# Patient Record
Sex: Male | Born: 1965 | Race: White | Hispanic: No | Marital: Married | Smoking: Never smoker
Health system: Southern US, Community
[De-identification: ages and names within clinical notes are randomized; demographics above are authoritative.]

## PROBLEM LIST (undated history)

## (undated) DIAGNOSIS — E785 Hyperlipidemia, unspecified: Secondary | ICD-10-CM

## (undated) HISTORY — DX: Hyperlipidemia, unspecified: E78.5

---

## 2012-05-21 ENCOUNTER — Other Ambulatory Visit (HOSPITAL_COMMUNITY): Payer: Self-pay | Admitting: Internal Medicine

## 2012-05-22 ENCOUNTER — Other Ambulatory Visit (HOSPITAL_COMMUNITY): Payer: Self-pay | Admitting: Internal Medicine

## 2012-05-22 DIAGNOSIS — Z8249 Family history of ischemic heart disease and other diseases of the circulatory system: Secondary | ICD-10-CM

## 2012-05-24 ENCOUNTER — Encounter (HOSPITAL_COMMUNITY): Payer: BC Managed Care – PPO

## 2012-05-25 ENCOUNTER — Ambulatory Visit (HOSPITAL_COMMUNITY)
Admission: RE | Admit: 2012-05-25 | Discharge: 2012-05-25 | Disposition: A | Discharge status: Home / Self Care | Payer: BC Managed Care – PPO | Source: Ambulatory Visit | Attending: Cardiovascular Disease | Admitting: Cardiovascular Disease

## 2012-05-25 DIAGNOSIS — Z8249 Family history of ischemic heart disease and other diseases of the circulatory system: Secondary | ICD-10-CM

## 2012-10-11 ENCOUNTER — Encounter: Payer: Self-pay | Admitting: Cardiology

## 2014-02-25 ENCOUNTER — Ambulatory Visit: Payer: BC Managed Care – PPO | Admitting: Sports Medicine

## 2014-03-04 ENCOUNTER — Ambulatory Visit
Admission: RE | Admit: 2014-03-04 | Discharge: 2014-03-04 | Disposition: A | Discharge status: Home / Self Care | Payer: 59 | Source: Ambulatory Visit | Attending: Sports Medicine | Admitting: Sports Medicine

## 2014-03-04 ENCOUNTER — Ambulatory Visit (INDEPENDENT_AMBULATORY_CARE_PROVIDER_SITE_OTHER): Payer: 59 | Admitting: Sports Medicine

## 2014-03-04 ENCOUNTER — Encounter: Payer: Self-pay | Admitting: Sports Medicine

## 2014-03-04 VITALS — BP 122/80 | Ht 77.0 in | Wt 250.0 lb

## 2014-03-04 DIAGNOSIS — M25561 Pain in right knee: Secondary | ICD-10-CM

## 2014-03-04 DIAGNOSIS — M25559 Pain in unspecified hip: Secondary | ICD-10-CM

## 2014-03-04 DIAGNOSIS — M25552 Pain in left hip: Principal | ICD-10-CM

## 2014-03-04 DIAGNOSIS — M25572 Pain in left ankle and joints of left foot: Secondary | ICD-10-CM

## 2014-03-04 DIAGNOSIS — M25569 Pain in unspecified knee: Secondary | ICD-10-CM

## 2014-03-04 DIAGNOSIS — M25551 Pain in right hip: Secondary | ICD-10-CM

## 2014-03-04 DIAGNOSIS — M25579 Pain in unspecified ankle and joints of unspecified foot: Secondary | ICD-10-CM

## 2014-03-04 NOTE — Progress Notes (Signed)
Subjective:    Patient ID: Roy Bennett, male    DOB: [birth year], 48 y.o.   MRN: [id number]  HPI Pleasant 48 y.o. male presents to clinic with multiple complaints including 6 months Right knee pain, 2-4 year Left hip pain, and 10+ years of Left ankle pain. He also states that he has had chronic lumbar back pain diagnosed 2 years prior in [place] when he was found on MRI to have multiple disc disease that was managed with physical therapy.  He begins by describing his left ankle pain as being anterolateral, has been present since age 22 since unspecified "ankle injury" with non-traumatic mechanism that was not addressed with any surgical intervention. Since that time he feels his left ankle has always been weaker than the right, no specific exacerbating factors other than weight bearing. No recent overuse or injury endorsed.  In regards to his left hip, this has been present for several years, he points to a location just lateral to his left ASIS as the maximal point of pain. This radiates deep but he denies radiation to groin. This is worsened with hip flexion and weight bearing. He denies pain with sitting. No specific timing of the pain. He has not done anything for this.   Finally, his right knee pain has been going on for approximately 6 months, denies any specific trauma to this site. He describes it as being in the posterior aspect of his knee in a position that is inferolateral. Has been gradual and constant. No specific timing. There is no radiation. He has not felt any clicking or knee-catching. No swelling or bruising at the onset or now.   He has had increase in activity for the past 6-8 months, specifically he started a walking program where he walks 45-60 minutes per day followed by 3 sessions of the "7 minute workout" with various calisthenics such as squats, planks, etc. He endorses and intentional weight loss of 30 pounds over this same time frame.   Review of Systems Per HPI      Objective:   Physical Exam Filed Vitals:   03/04/14 1039  BP: 122/80  Height: 6\' 5"  (1.956 m)  Weight: 250 lb (113.399 kg)  Body mass index is 29.64 kg/(m^2).  General: Well-developed, well-nourished male in NAD, AAOx3  Left Ankle: No visible erythema or swelling. Range of motion is full and painless in all directions and symmetric Strength is 5/5 in all directions. Stable lateral and medial ligaments; squeeze test and kleiger test unremarkable; Talar dome nontender; No pain at base of 5th MT; No tenderness over cuboid; No tenderness on posterior aspects of lateral and medial malleolus No sign of peroneal tendon subluxations Able to walk 4 steps.  Hip: Inspection with no bruising over bony prominences ROM IR: 80 Deg, ER: 80 Deg, Flexion: 120 Deg, Extension: 100 Deg, Abduction: 45 Deg, Adduction: 45 Deg Strength IR: 5/5, ER: 5/5, Flexion: 5/5, Extension: 5/5, marked weakness in abductors, Adduction: 5/5 Pelvic alignment unremarkable to inspection and palpation. Standing hip rotation and gait with borderline / positive bilateral trendelenburg & unsteadiness. Greater trochanter without tenderness to palpation. Tenderness to ASIS No tenderness over piriformis and greater trochanter. Negative piriformis, negative FABER No SI joint tenderness and normal minimal SI movement.  Right Knee: Normal to inspection with no erythema or effusion or obvious bony abnormalities. Palpation with no warmth or joint line tenderness or patellar tenderness or condyle tenderness Palpation does reveal tenderness to lateral origin of gastrocnemius ROM normal in flexion and  mildly decreased extension and normal lower leg rotation. Ligaments with solid consistent endpoints including ACL, PCL, LCL, MCL. Negative Mcmurray's and provocative meniscal tests. Non painful patellar compression. Patellar and quadriceps tendons unremarkable. Hamstring and quadriceps strength is normal.  Feet appear to be in  a neutral to somewhat planus medial arch position. Walking and running evaluation shows markedly pronation with hindfoot valgus. No limp.     Assessment & Plan:  1. Left ankle pain History of old unspecified injury, most likely has caused upward chain stress on knee and ultimately hip. No acute pathology and most likely exacerbated by poor stride and pronation during running.  - Refer to physical therapy to evaluate and treat weakness Roy Bennett) - Give sports inserts with bilateral medial heel wedges  2. Left hip pain Concern for secondary pain from feet / ankles, will need to evaluate for underlying degenerative hip changes with XR; chronic process - Obtain bilateral AP and lateral hip views - Refer to physical therapy to evaluate and treat weakness  3. Right knee pain Tenderness to lateral gastroc insertion site, most likely proximal gastroc strain that has been caused / exacerbated by foot pronation during running - Sport inserts with heel supports as above - Activity as tolerated  Images reviewed:  LEFT HIP - COMPLETE 2+ VIEW  COMPARISON: None.  FINDINGS:  Mild degenerative change of the hip joint. No acute fracture. No  dislocation. No destructive bone lesion.  IMPRESSION:  No acute bony pathology.  RIGHT HIP - COMPLETE 2+ VIEW  COMPARISON: None.  FINDINGS:  No fracture or bone lesion. Hip joint is normally space and aligned  with no significant arthropathic change. Soft tissues are  unremarkable.  IMPRESSION:  Negative.  Followup in 6 weeks. We will need to plan on custom orthotics at that time.   Rosette Reveal, MD Roy Bennett [facility] PGY-3

## 2014-03-05 ENCOUNTER — Telehealth: Payer: Self-pay | Admitting: *Deleted

## 2014-03-05 NOTE — Telephone Encounter (Signed)
Message copied by Laurey Arrow on Wed Mar 05, 2014  9:23 AM ------      Message from: Lilia Argue R      Created: Wed Mar 05, 2014  8:53 AM      Regarding: xray results       Please call this patient and tell him that the x-rays of his hips look good. He has some very mild arthritis in the left hip but not bad. I think he has a followup appointment with me in about 6 weeks.            ----- Message -----         From: Rad Results In Interface         Sent: 03/04/2014   2:21 PM           To: Thurman Coyer, DO                   ------

## 2014-04-14 ENCOUNTER — Ambulatory Visit: Payer: 59 | Admitting: Sports Medicine

## 2014-04-15 ENCOUNTER — Encounter: Payer: Self-pay | Admitting: Sports Medicine

## 2014-04-15 ENCOUNTER — Ambulatory Visit (INDEPENDENT_AMBULATORY_CARE_PROVIDER_SITE_OTHER): Payer: 59 | Admitting: Sports Medicine

## 2014-04-15 VITALS — BP 121/77 | Ht 77.0 in | Wt 245.0 lb

## 2014-04-15 DIAGNOSIS — M2141 Flat foot [pes planus] (acquired), right foot: Secondary | ICD-10-CM

## 2014-04-15 DIAGNOSIS — M2142 Flat foot [pes planus] (acquired), left foot: Secondary | ICD-10-CM

## 2014-04-15 NOTE — Assessment & Plan Note (Addendum)
Pt fitted for custom orthotics today.  He has done well with the Hapad insoles with support.  Does have some straining of the posterior tibialis on the L foot with correction of his gait from previous visit.  Continue to monitor and consider scan if no improvement at next visit.    Patient was fitted for a standard, cushioned, semi-rigid orthotic. The orthotic was heated and afterward the patient stood on the orthotic blank positioned on the orthotic stand. The patient was positioned in subtalar neutral position and 10 degrees of ankle dorsiflexion in a weight bearing stance. After completion of molding, a stable base was applied to the orthotic blank. The blank was ground to a stable position for weight bearing. Size: 14 Base: Blue EVA Posting: None  Additional orthotic padding: None   F/U in 6 wks to see how doing.  If at that point he is interested in custom orthotics for his work shoes, we can make him a pair of semi-rigid work orthotics.

## 2014-04-15 NOTE — Progress Notes (Signed)
Roy Bennett is a 48 y.o. male who presents today for evaluation for orthotics.   Pt was seen about 6 weeks ago for multiple chronic issues including low back, L hip, R knee, and L ankle pain.   He does endorse a previous injury, unknown severity, to his L ankle some years ago.  At last visit, he was fitted with green inserts with medial scaphoid pads for arch support and sent to PT with Dr. Belia Heman for PT of the hip and knee.  Since that point, he has done well, pain in his hip and R knee have improved.  At this point, his lateral L ankle pain has improved now but starting to have more medial pain around the medial malleolus of the L ankle.  He does endorse walking around 2-3 miles per day, sharp pain in the medial malleoli, denies injury, is R foot dominant, and states pain will go away after walking.  He has not tried any ice or OTC medications in this area.    PMHx, PSHx, FHx, social Hx, allergies reviewed and updated.   ROS: Per HPI.  All other systems reviewed and are negative.   Physical Exam Filed Vitals:   04/15/14 0903  BP: 121/77    Physical Examination: General appearance - alert, well appearing, and in no distress Foot Exam:  B/L Pes planus w/ calcaneal valgus.  Collapse of the transverse arch with splaying of toes 2-5 L > R.  No TTP at the MT heads, no Morton's Callous B/L.  ROM full in all ankle planes in both active/passive ROM.  MS 5/5, Neurovascularly intact B/L Foot. Slight calcaneal valgus that worsens with intoeing/plantar flexion for posterior tibialis examination   >50% of the 25 minute visit was spent counseling and discussing management options with the patient.

## 2014-05-26 ENCOUNTER — Ambulatory Visit: Payer: 59 | Admitting: Sports Medicine

## 2014-11-24 ENCOUNTER — Other Ambulatory Visit (HOSPITAL_COMMUNITY): Payer: Self-pay | Admitting: Internal Medicine

## 2014-11-25 ENCOUNTER — Other Ambulatory Visit: Payer: Self-pay | Admitting: Internal Medicine

## 2014-11-25 DIAGNOSIS — Z139 Encounter for screening, unspecified: Secondary | ICD-10-CM

## 2014-11-28 ENCOUNTER — Ambulatory Visit
Admission: RE | Admit: 2014-11-28 | Discharge: 2014-11-28 | Disposition: A | Discharge status: Home / Self Care | Payer: No Typology Code available for payment source | Source: Ambulatory Visit | Attending: Internal Medicine | Admitting: Internal Medicine

## 2014-11-28 DIAGNOSIS — Z139 Encounter for screening, unspecified: Secondary | ICD-10-CM

## 2016-07-01 DIAGNOSIS — Z125 Encounter for screening for malignant neoplasm of prostate: Secondary | ICD-10-CM | POA: Diagnosis not present

## 2016-07-01 DIAGNOSIS — Z13220 Encounter for screening for lipoid disorders: Secondary | ICD-10-CM | POA: Diagnosis not present

## 2016-07-01 DIAGNOSIS — Z0000 Encounter for general adult medical examination without abnormal findings: Secondary | ICD-10-CM | POA: Diagnosis not present

## 2016-07-12 DIAGNOSIS — Z1212 Encounter for screening for malignant neoplasm of rectum: Secondary | ICD-10-CM | POA: Diagnosis not present

## 2016-07-12 DIAGNOSIS — E789 Disorder of lipoprotein metabolism, unspecified: Secondary | ICD-10-CM | POA: Diagnosis not present

## 2016-07-12 DIAGNOSIS — Z0001 Encounter for general adult medical examination with abnormal findings: Secondary | ICD-10-CM | POA: Diagnosis not present

## 2017-12-08 ENCOUNTER — Encounter: Payer: Self-pay | Admitting: Internal Medicine

## 2018-03-22 DIAGNOSIS — L719 Rosacea, unspecified: Secondary | ICD-10-CM | POA: Diagnosis not present

## 2018-03-22 DIAGNOSIS — Z125 Encounter for screening for malignant neoplasm of prostate: Secondary | ICD-10-CM | POA: Diagnosis not present

## 2018-03-22 DIAGNOSIS — J309 Allergic rhinitis, unspecified: Secondary | ICD-10-CM | POA: Diagnosis not present

## 2018-03-22 DIAGNOSIS — Z0000 Encounter for general adult medical examination without abnormal findings: Secondary | ICD-10-CM | POA: Diagnosis not present

## 2018-03-22 DIAGNOSIS — T781XXA Other adverse food reactions, not elsewhere classified, initial encounter: Secondary | ICD-10-CM | POA: Diagnosis not present

## 2018-03-22 DIAGNOSIS — J3089 Other allergic rhinitis: Secondary | ICD-10-CM | POA: Diagnosis not present

## 2018-06-11 DIAGNOSIS — M9905 Segmental and somatic dysfunction of pelvic region: Secondary | ICD-10-CM | POA: Diagnosis not present

## 2018-06-11 DIAGNOSIS — M9903 Segmental and somatic dysfunction of lumbar region: Secondary | ICD-10-CM | POA: Diagnosis not present

## 2018-06-11 DIAGNOSIS — M62830 Muscle spasm of back: Secondary | ICD-10-CM | POA: Diagnosis not present

## 2018-06-11 DIAGNOSIS — M9902 Segmental and somatic dysfunction of thoracic region: Secondary | ICD-10-CM | POA: Diagnosis not present

## 2018-06-21 DIAGNOSIS — M9905 Segmental and somatic dysfunction of pelvic region: Secondary | ICD-10-CM | POA: Diagnosis not present

## 2018-06-21 DIAGNOSIS — M62830 Muscle spasm of back: Secondary | ICD-10-CM | POA: Diagnosis not present

## 2018-06-21 DIAGNOSIS — M9903 Segmental and somatic dysfunction of lumbar region: Secondary | ICD-10-CM | POA: Diagnosis not present

## 2018-06-21 DIAGNOSIS — M9902 Segmental and somatic dysfunction of thoracic region: Secondary | ICD-10-CM | POA: Diagnosis not present

## 2018-06-25 DIAGNOSIS — M62830 Muscle spasm of back: Secondary | ICD-10-CM | POA: Diagnosis not present

## 2018-06-25 DIAGNOSIS — M9902 Segmental and somatic dysfunction of thoracic region: Secondary | ICD-10-CM | POA: Diagnosis not present

## 2018-06-25 DIAGNOSIS — M9905 Segmental and somatic dysfunction of pelvic region: Secondary | ICD-10-CM | POA: Diagnosis not present

## 2018-06-25 DIAGNOSIS — M9903 Segmental and somatic dysfunction of lumbar region: Secondary | ICD-10-CM | POA: Diagnosis not present

## 2018-06-29 DIAGNOSIS — M9905 Segmental and somatic dysfunction of pelvic region: Secondary | ICD-10-CM | POA: Diagnosis not present

## 2018-06-29 DIAGNOSIS — M9903 Segmental and somatic dysfunction of lumbar region: Secondary | ICD-10-CM | POA: Diagnosis not present

## 2018-06-29 DIAGNOSIS — M9902 Segmental and somatic dysfunction of thoracic region: Secondary | ICD-10-CM | POA: Diagnosis not present

## 2018-06-29 DIAGNOSIS — M62830 Muscle spasm of back: Secondary | ICD-10-CM | POA: Diagnosis not present

## 2018-07-11 DIAGNOSIS — Z1211 Encounter for screening for malignant neoplasm of colon: Secondary | ICD-10-CM | POA: Diagnosis not present

## 2018-07-11 DIAGNOSIS — Z1212 Encounter for screening for malignant neoplasm of rectum: Secondary | ICD-10-CM | POA: Diagnosis not present

## 2018-07-19 ENCOUNTER — Encounter: Payer: Self-pay | Admitting: Gastroenterology

## 2018-08-02 DIAGNOSIS — K5730 Diverticulosis of large intestine without perforation or abscess without bleeding: Secondary | ICD-10-CM | POA: Diagnosis not present

## 2018-08-02 DIAGNOSIS — R195 Other fecal abnormalities: Secondary | ICD-10-CM | POA: Diagnosis not present

## 2018-08-02 DIAGNOSIS — R8589 Other abnormal findings in specimens from digestive organs and abdominal cavity: Secondary | ICD-10-CM | POA: Diagnosis not present

## 2018-08-02 DIAGNOSIS — K648 Other hemorrhoids: Secondary | ICD-10-CM | POA: Diagnosis not present

## 2018-08-02 DIAGNOSIS — Z1211 Encounter for screening for malignant neoplasm of colon: Secondary | ICD-10-CM | POA: Diagnosis not present

## 2018-08-02 DIAGNOSIS — K635 Polyp of colon: Secondary | ICD-10-CM | POA: Diagnosis not present

## 2018-08-02 DIAGNOSIS — D122 Benign neoplasm of ascending colon: Secondary | ICD-10-CM | POA: Diagnosis not present

## 2018-08-03 DIAGNOSIS — M9905 Segmental and somatic dysfunction of pelvic region: Secondary | ICD-10-CM | POA: Diagnosis not present

## 2018-08-03 DIAGNOSIS — M9903 Segmental and somatic dysfunction of lumbar region: Secondary | ICD-10-CM | POA: Diagnosis not present

## 2018-08-03 DIAGNOSIS — M62830 Muscle spasm of back: Secondary | ICD-10-CM | POA: Diagnosis not present

## 2018-08-03 DIAGNOSIS — M9902 Segmental and somatic dysfunction of thoracic region: Secondary | ICD-10-CM | POA: Diagnosis not present

## 2018-08-09 ENCOUNTER — Encounter: Payer: No Typology Code available for payment source | Admitting: Gastroenterology

## 2018-08-24 DIAGNOSIS — M9902 Segmental and somatic dysfunction of thoracic region: Secondary | ICD-10-CM | POA: Diagnosis not present

## 2018-08-24 DIAGNOSIS — M62830 Muscle spasm of back: Secondary | ICD-10-CM | POA: Diagnosis not present

## 2018-08-24 DIAGNOSIS — M9903 Segmental and somatic dysfunction of lumbar region: Secondary | ICD-10-CM | POA: Diagnosis not present

## 2018-08-24 DIAGNOSIS — M9905 Segmental and somatic dysfunction of pelvic region: Secondary | ICD-10-CM | POA: Diagnosis not present

## 2018-10-17 DIAGNOSIS — Z8619 Personal history of other infectious and parasitic diseases: Secondary | ICD-10-CM | POA: Diagnosis not present

## 2018-10-23 DIAGNOSIS — E782 Mixed hyperlipidemia: Secondary | ICD-10-CM | POA: Diagnosis not present

## 2018-10-23 DIAGNOSIS — Z0000 Encounter for general adult medical examination without abnormal findings: Secondary | ICD-10-CM | POA: Diagnosis not present

## 2018-10-23 DIAGNOSIS — Z8619 Personal history of other infectious and parasitic diseases: Secondary | ICD-10-CM | POA: Diagnosis not present

## 2019-04-08 ENCOUNTER — Other Ambulatory Visit: Payer: Self-pay | Admitting: Internal Medicine

## 2019-04-08 DIAGNOSIS — R221 Localized swelling, mass and lump, neck: Secondary | ICD-10-CM

## 2019-04-16 ENCOUNTER — Ambulatory Visit
Admission: RE | Admit: 2019-04-16 | Discharge: 2019-04-16 | Disposition: A | Discharge status: Home / Self Care | Payer: 59 | Source: Ambulatory Visit | Attending: Internal Medicine | Admitting: Internal Medicine

## 2019-04-16 DIAGNOSIS — R221 Localized swelling, mass and lump, neck: Secondary | ICD-10-CM

## 2019-04-24 ENCOUNTER — Other Ambulatory Visit: Payer: Self-pay | Admitting: Internal Medicine

## 2019-04-25 ENCOUNTER — Other Ambulatory Visit: Payer: Self-pay | Admitting: Internal Medicine

## 2019-04-25 DIAGNOSIS — R221 Localized swelling, mass and lump, neck: Secondary | ICD-10-CM

## 2019-04-25 DIAGNOSIS — E782 Mixed hyperlipidemia: Secondary | ICD-10-CM

## 2019-04-30 ENCOUNTER — Other Ambulatory Visit: Payer: Self-pay | Admitting: Internal Medicine

## 2019-05-01 ENCOUNTER — Ambulatory Visit
Admission: RE | Admit: 2019-05-01 | Discharge: 2019-05-01 | Disposition: A | Discharge status: Home / Self Care | Payer: 59 | Source: Ambulatory Visit | Attending: Internal Medicine | Admitting: Internal Medicine

## 2019-05-01 ENCOUNTER — Other Ambulatory Visit (HOSPITAL_COMMUNITY)
Admission: RE | Admit: 2019-05-01 | Discharge: 2019-05-01 | Disposition: A | Discharge status: Home / Self Care | Payer: 59 | Source: Ambulatory Visit | Attending: Internal Medicine | Admitting: Internal Medicine

## 2019-05-01 DIAGNOSIS — R221 Localized swelling, mass and lump, neck: Secondary | ICD-10-CM | POA: Diagnosis present

## 2019-05-01 DIAGNOSIS — E782 Mixed hyperlipidemia: Secondary | ICD-10-CM | POA: Insufficient documentation

## 2019-05-01 DIAGNOSIS — D34 Benign neoplasm of thyroid gland: Secondary | ICD-10-CM | POA: Insufficient documentation

## 2019-05-01 NOTE — Procedures (Signed)
PROCEDURE SUMMARY:  Using direct ultrasound guidance, 5 passes were made using 25 g needles into the nodule within the right lobe of the thyroid.   Ultrasound was used to confirm needle placements on all occasions.   EBL = trace  Specimens were sent to Pathology for analysis.  See procedure note under Imaging tab in Epic for full procedure details.  WENDY S BLAIR PA-C 05/01/2019 2:53 PM

## 2019-05-02 LAB — CYTOLOGY - NON PAP

## 2019-05-21 ENCOUNTER — Other Ambulatory Visit: Payer: 59

## 2019-11-22 ENCOUNTER — Ambulatory Visit (INDEPENDENT_AMBULATORY_CARE_PROVIDER_SITE_OTHER): Payer: 59 | Admitting: Family Medicine

## 2019-11-22 DIAGNOSIS — Z5329 Procedure and treatment not carried out because of patient's decision for other reasons: Secondary | ICD-10-CM

## 2019-11-22 NOTE — Progress Notes (Signed)
No show

## 2019-11-25 ENCOUNTER — Ambulatory Visit (INDEPENDENT_AMBULATORY_CARE_PROVIDER_SITE_OTHER): Payer: 59 | Admitting: Family Medicine

## 2019-11-25 ENCOUNTER — Other Ambulatory Visit: Payer: Self-pay

## 2019-11-25 ENCOUNTER — Ambulatory Visit (INDEPENDENT_AMBULATORY_CARE_PROVIDER_SITE_OTHER): Payer: 59

## 2019-11-25 ENCOUNTER — Encounter: Payer: Self-pay | Admitting: Family Medicine

## 2019-11-25 ENCOUNTER — Ambulatory Visit: Payer: Self-pay

## 2019-11-25 VITALS — BP 110/82 | HR 75 | Ht 77.0 in | Wt 263.4 lb

## 2019-11-25 DIAGNOSIS — M25552 Pain in left hip: Secondary | ICD-10-CM

## 2019-11-25 DIAGNOSIS — G8929 Other chronic pain: Secondary | ICD-10-CM | POA: Diagnosis not present

## 2019-11-25 NOTE — Patient Instructions (Signed)
Thank you for coming in today. Plan for xray today.  If it shows arthritis or AVN we will proceed to hip replacement.  If it looks normal we will do MRI arthrogram.  I will contact you soon about Xray results.  Keep me updated.

## 2019-11-25 NOTE — Progress Notes (Signed)
Subjective:    I'm seeing this patient as a consultation for:  Dr. Shelia Media. Note will be routed back to referring provider/PCP.  CC: L hip pain  I, Molly Weber, LAT, ATC, am serving as scribe for Dr. Lynne Leader.  HPI: Pt is a 54 y/o male presenting w/ c/o chronic L hip pain that recently flared up a little over a week ago.  He locates his pain to his L ant hip.  He rate his pain as moderate. 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  He was previously seen at [facility] and has had 2 prior hip injections and a trial of physical therapy.  His last x-ray was several years ago.  He thinks the hip injection did not help.  Radiating pain: yes into L thigh Low back pain: yes LE numbness/tingling: yes Aggravating factors: walking; prolonged sitting; Treatments tried: 2 hip injections; PT;   Past medical history, Surgical history, Family history, Social history, Allergies, and medications have been entered into the medical record, reviewed.   Review of Systems: No new headache, visual changes, nausea, vomiting, diarrhea, constipation, dizziness, abdominal pain, skin rash, fevers, chills, night sweats, weight loss, swollen lymph nodes, body aches, joint swelling, muscle aches, chest pain, shortness of breath, mood changes, visual or auditory hallucinations.   Objective:    Vitals:   11/25/19 0830  BP: 110/82  Pulse: 75  SpO2: 96%   General: Well Developed, well nourished, and in no acute distress.  Neuro/Psych: Alert and oriented x3, extra-ocular muscles intact, able to move all 4 extremities, sensation grossly intact. Skin: Warm and dry, no rashes noted.  Respiratory: Not using accessory muscles, speaking in full sentences, trachea midline.  Cardiovascular: Pulses palpable, no extremity edema. Abdomen: Does not appear distended. MSK: Left hip normal-appearing Range of motion significantly limited internal and moderate limited external rotation.  Limited flexion with pain. Passive hip motion and  flexion reproduces pain. Positive FADIR test. Strength intact to flexion.  Diminished 4/5 abduction and adduction  Contralateral right hip normal-appearing Range of motion also slightly limited internal rotation. Strength intact. No pain with motion.  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 Await formal radiology review  Impression and Recommendations:    Assessment and Plan: 54 y.o. 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  Await formal radiology over read however am concerned based on physical exam and x-ray appearance per my interpretation.  At this point he is already had good trial of conservative management including injections and physical therapy.  He has significant pain affecting his quality of life.  Discussed with the patient plan to proceed with referral to orthopedic surgery for surgical intervention.  Expect benefit from total hip replacement.  Doubtful that hip scope would be very helpful in this context..   Orders Placed This Encounter  Procedures  . [place] LIMITED JOINT SPACE STRUCTURES LOW LEFT(NO LINKED CHARGES)    Order Specific Question:   Reason for Exam (SYMPTOM  OR DIAGNOSIS REQUIRED)    Answer:   L hip pain    Order Specific Question:   Preferred imaging location?    Answer:   [facility]  . DG Hip Unilat W OR W/O Pelvis 2-3 Views Left    Standing Status:   Future    Number of Occurrences:   1    Standing Expiration  Date:   11/24/2020    Order Specific Question:   Reason for Exam (SYMPTOM  OR DIAGNOSIS REQUIRED)    Answer:   eval hip DJD?    Order Specific Question:   Preferred imaging location?    Answer:   [facility]    Order Specific Question:   Radiology Contrast Protocol - do NOT remove file path    Answer:    \\[email or web address]  . Ambulatory referral to Orthopedic Surgery    Referral Priority:   Routine    Referral Type:   Surgical    Referral Reason:   Specialty Services Required    Requested Specialty:   Orthopedic Surgery    Number of Visits Requested:   1   No orders of the defined types were placed in this encounter.   Discussed warning signs or symptoms. Please see discharge instructions. Patient expresses understanding.   The above documentation has been reviewed and is accurate and complete Lynne Leader, M.D.

## 2019-11-26 ENCOUNTER — Telehealth: Payer: Self-pay | Admitting: Family Medicine

## 2019-11-26 DIAGNOSIS — G8929 Other chronic pain: Secondary | ICD-10-CM

## 2019-11-26 DIAGNOSIS — M25552 Pain in left hip: Secondary | ICD-10-CM

## 2019-11-26 NOTE — Telephone Encounter (Signed)
Plan for hip MRI arthrogram at [facility].  Will schedule with Dr. Dianah Field for the hip injection 1 hour prior to the MRI.

## 2019-11-26 NOTE — Progress Notes (Signed)
Left hip shows mild arthritis. I am concerned for hip impingement. I recommend that we proceed to MRI arthrogram.

## 2019-12-02 ENCOUNTER — Ambulatory Visit: Payer: 59 | Admitting: Sports Medicine

## 2019-12-09 ENCOUNTER — Ambulatory Visit (INDEPENDENT_AMBULATORY_CARE_PROVIDER_SITE_OTHER): Payer: 59 | Admitting: Sports Medicine

## 2019-12-09 ENCOUNTER — Other Ambulatory Visit: Payer: Self-pay

## 2019-12-09 ENCOUNTER — Ambulatory Visit (INDEPENDENT_AMBULATORY_CARE_PROVIDER_SITE_OTHER): Payer: 59

## 2019-12-09 DIAGNOSIS — M25552 Pain in left hip: Secondary | ICD-10-CM

## 2019-12-09 DIAGNOSIS — G8929 Other chronic pain: Secondary | ICD-10-CM

## 2019-12-09 MED ORDER — GADOBUTROL 1 MMOL/ML IV SOLN
1.0000 mL | Freq: Once | INTRAVENOUS | Status: AC | PRN
  Administered 2019-12-09: 1 mL via INTRAVENOUS

## 2019-12-09 NOTE — Progress Notes (Signed)
    Procedures performed today:    Procedure: Real-time Ultrasound Guided gadolinium contrast injection of left hip Device: Samsung HS60  Verbal informed consent obtained.  Time-out conducted.  Noted no overlying erythema, induration, or other signs of local infection.  Skin prepped in a sterile fashion.  Local anesthesia: Topical Ethyl chloride.  With sterile technique and under real time ultrasound guidance: Using a 22 spinal needle advanced to the femoral head/neck junction, contacted bone and then injected 1 cc Kenalog 40, 2 cc lidocaine, 2 cc bupivacaine, syringe switched and 0.1 cc gadolinium injected, syringe again switched and 10 cc saline used to distend the joint. Joint visualized and capsule seen distending confirming intra-articular placement of contrast material and medication. Completed without difficulty  Advised to call if fevers/chills, erythema, induration, drainage, or persistent bleeding.  Images permanently stored and available for review in the ultrasound unit.  Impression: Technically successful ultrasound guided gadolinium contrast injection for MR arthrography.  Please see separate MR arthrogram report.   Independent interpretation of notes and tests performed by another provider:   None.  Brief History, Exam, Impression, and Recommendations:    Chronic left hip pain Injection for hip MR arthrogram performed today. Further follow-up with primary treating provider.    ___________________________________________ Gwen Her. Dianah Field, M.D., ABFM., CAQSM. Primary Care and [place] Instructor of [place] [facility]

## 2019-12-09 NOTE — Assessment & Plan Note (Signed)
Injection for hip MR arthrogram performed today. Further follow-up with primary treating provider.

## 2019-12-10 NOTE — Progress Notes (Signed)
Roy Bennett hip shows bilateral arthritis medium and bilateral labral tear.  Please schedule follow-up with me in the near future to review the results further and discuss next steps.

## 2019-12-13 ENCOUNTER — Ambulatory Visit: Payer: 59 | Admitting: Orthopaedic Surgery

## 2020-01-03 ENCOUNTER — Ambulatory Visit: Payer: 59 | Admitting: Family Medicine

## 2020-01-03 ENCOUNTER — Encounter: Payer: Self-pay | Admitting: Orthopaedic Surgery

## 2020-01-03 ENCOUNTER — Ambulatory Visit (INDEPENDENT_AMBULATORY_CARE_PROVIDER_SITE_OTHER): Payer: Self-pay | Admitting: Orthopaedic Surgery

## 2020-01-03 VITALS — Ht 77.0 in | Wt 266.6 lb

## 2020-01-03 DIAGNOSIS — M1612 Unilateral primary osteoarthritis, left hip: Secondary | ICD-10-CM | POA: Insufficient documentation

## 2020-01-03 NOTE — Progress Notes (Signed)
Office Visit Note   Patient: Roy Bennett           Date of Birth: August 20, 1965           MRN: [id number] Visit Date: 01/03/2020              Requested by: Gregor Hams, MD [place],  [place] [address] PCP: Deland Pretty, MD   Assessment & Plan: Visit Diagnoses:  1. Primary osteoarthritis of left hip     Plan: Impression is left hip moderate DJD.  X-rays and MRIs were reviewed with the patient and we had a lengthy discussion on his options which she will take time to think about.  In the meantime he will continue take OTC NSAIDs as needed.  Activity as tolerated.  We gave him information on hip replacement surgery.  Follow-Up Instructions: Return if symptoms worsen or fail to improve.   Orders:  No orders of the defined types were placed in this encounter.  No orders of the defined types were placed in this encounter.     Procedures: No procedures performed   Clinical Data: No additional findings.   Subjective: Chief Complaint  Patient presents with  . Left Hip - Pain    Roy Bennett is a 54 year old who is here for evaluation of chronic left hip pain.  He is referral from [facility].  He recently had MRI showing a moderate left hip DJD with degenerative labral tear.  He states that he has had a decline in activity and mobility as a result of this.  He has no groin pain that radiates down the thigh in the medial knee.  Denies any radicular symptoms.  He semiretired.  He used to work as a Optometrist.  Feels much older as a result of the hip pain.   Review of Systems  Constitutional: Negative.   All other systems reviewed and are negative.    Objective: Vital Signs: Ht 6\' 5"  (1.956 m)   Wt 266 lb 9.6 oz (120.9 kg)   BMI 31.61 kg/m   Physical Exam Vitals and nursing note reviewed.  Constitutional:      Appearance: He is well-developed.  HENT:     Head: Normocephalic and atraumatic.  Eyes:     Pupils: Pupils are equal, round, and reactive to  light.  Pulmonary:     Effort: Pulmonary effort is normal.  Abdominal:     Palpations: Abdomen is soft.  Musculoskeletal:        General: Normal range of motion.     Cervical back: Neck supple.  Skin:    General: Skin is warm.  Neurological:     Mental Status: He is alert and oriented to person, place, and time.  Psychiatric:        Behavior: Behavior normal.        Thought Content: Thought content normal.        Judgment: Judgment normal.     Ortho Exam Left hip shows minimal internal rotation with pain.  Positive Stinchfield sign.  Lateral hip is nontender. Specialty Comments:  No specialty comments available.  Imaging: No results found.   PMFS History: Patient Active Problem List   Diagnosis Date Noted  . Primary osteoarthritis of left hip 01/03/2020  . Chronic left hip pain 12/09/2019  . Pes planus of both feet 04/15/2014   History reviewed. No pertinent past medical history.  History reviewed. No pertinent family history.  History reviewed. No pertinent surgical history. Social History  Occupational History  . Not on file  Tobacco Use  . Smoking status: Never Smoker  . Smokeless tobacco: Never Used  Substance and Sexual Activity  . Alcohol use: Not on file  . Drug use: Not on file  . Sexual activity: Not on file

## 2020-05-18 ENCOUNTER — Other Ambulatory Visit: Payer: Self-pay | Admitting: Internal Medicine

## 2020-05-18 DIAGNOSIS — E041 Nontoxic single thyroid nodule: Secondary | ICD-10-CM

## 2020-06-01 ENCOUNTER — Ambulatory Visit
Admission: RE | Admit: 2020-06-01 | Discharge: 2020-06-01 | Disposition: A | Discharge status: Home / Self Care | Payer: Commercial Managed Care - PPO | Source: Ambulatory Visit | Attending: Internal Medicine | Admitting: Internal Medicine

## 2020-06-01 DIAGNOSIS — E041 Nontoxic single thyroid nodule: Secondary | ICD-10-CM

## 2020-07-21 ENCOUNTER — Telehealth: Payer: Self-pay | Admitting: Family Medicine

## 2020-07-21 DIAGNOSIS — M25552 Pain in left hip: Secondary | ICD-10-CM

## 2020-07-21 DIAGNOSIS — G8929 Other chronic pain: Secondary | ICD-10-CM

## 2020-07-21 NOTE — Telephone Encounter (Signed)
Patient called asking if Dr Georgina Snell would be willing to refer him to the [place] office at the new [place] office for his left hip. He said that they have a heated pool there for therapy and this had helped him in the past.   Please advise.  * Pt last seen on 11/25/2019.

## 2020-07-21 NOTE — Telephone Encounter (Signed)
Referral ordered.  Please let me know if you do not hear or have trouble getting an appointment.

## 2020-07-22 NOTE — Telephone Encounter (Signed)
Called pt and informed him that PT referral has been placed to the new [place] location.  Pt verbalizes understanding and states that he was called yesterday to schedule and has his first PT appt on 07/30/20.

## 2020-07-30 ENCOUNTER — Ambulatory Visit (HOSPITAL_BASED_OUTPATIENT_CLINIC_OR_DEPARTMENT_OTHER): Payer: Commercial Managed Care - PPO | Admitting: Physical Therapy

## 2020-07-31 ENCOUNTER — Other Ambulatory Visit: Payer: Self-pay

## 2020-07-31 ENCOUNTER — Ambulatory Visit (HOSPITAL_BASED_OUTPATIENT_CLINIC_OR_DEPARTMENT_OTHER): Payer: Commercial Managed Care - PPO | Attending: Family Medicine | Admitting: Physical Therapy

## 2020-07-31 DIAGNOSIS — M25552 Pain in left hip: Secondary | ICD-10-CM | POA: Diagnosis present

## 2020-07-31 DIAGNOSIS — M25652 Stiffness of left hip, not elsewhere classified: Secondary | ICD-10-CM

## 2020-07-31 DIAGNOSIS — M6281 Muscle weakness (generalized): Secondary | ICD-10-CM | POA: Diagnosis not present

## 2020-07-31 DIAGNOSIS — R293 Abnormal posture: Secondary | ICD-10-CM | POA: Insufficient documentation

## 2020-07-31 NOTE — Addendum Note (Signed)
Addended by: Colbert Ewing MARIE L on: 07/31/2020 10:16 AM   Modules accepted: Orders

## 2020-07-31 NOTE — Therapy (Signed)
[facility] [address] [place], [place], [address] Phone: [phone]   Fax:  [phone]  Physical Therapy Evaluation  Patient Details  Name: Roy Bennett MRN: [id number] Date of Birth: 11-29-1965 Referring Provider (PT): Roy Hams, MD   Encounter Date: 07/31/2020   PT End of Session - 07/31/20 0903    Visit Number 1    Number of Visits 17    Date for PT Re-Evaluation 09/25/20    Authorization Type UHC    PT Start Time 0950    PT Stop Time 1040    PT Time Calculation (min) 50 min    Activity Tolerance Patient tolerated treatment well    Behavior During Therapy [facility] for tasks assessed/performed           No past medical history on file.  No past surgical history on file.  There were no vitals filed for this visit.    Subjective Assessment - 07/31/20 0852    Subjective Pt reports left hip issues since ~2017. Pt is not sure what the issues are -- he states it was diagnosed as low back/hip syndrome. Pt reports he herniated L4-5 about 20 years ago. Pt reports good results with aquatic therapy in the past for his herniated disc. Pt states sitting is the worst to his pain. Pt states he's been doing bridges that seems to help as well as clamshells.    Patient is accompained by: Family member    Pertinent History n/a    Limitations Sitting    How long can you sit comfortably? ~1 minute    How long can you stand comfortably? ~10 minutes    How long can you walk comfortably? no issues -- pt reports he likes walking with left LE on low side of slant    Patient Stated Goals Would like to get regular exericise program for hip strengthening/conditioning; improve flexibility    Currently in Pain? Yes    Pain Score 5     Pain Location Hip    Pain Orientation Left    Pain Descriptors / Indicators Aching    Pain Type Chronic pain;Acute pain    Pain Onset More than a month ago    Pain Frequency Constant    Aggravating  Factors  Sitting    Pain Relieving Factors Walking and staying active              [facility] PT Assessment - 07/31/20 0001      Assessment   Medical Diagnosis M25.552,G89.29 (ICD-10-CM) - Chronic left hip pain    Referring Provider (PT) Roy Hams, MD    Prior Therapy Aquatics around 2012-2013      Precautions   Precautions None      Restrictions   Weight Bearing Restrictions No      Balance Screen   Has the patient fallen in the past 6 months No      [place] residence    Living Arrangements Spouse/significant other;Children      Prior Function   Level of Independence Independent    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      Observation/Other Assessments   Focus on Therapeutic Outcomes (FOTO)  n/a      Strength   Right Hip Flexion 3+/5    Right Hip Extension 3/5    Right Hip External Rotation  3+/5    Right Hip Internal Rotation 3+/5    Right  Hip ABduction 4-/5    Right Knee Flexion 4/5    Right Knee Extension 4/5      Special Tests    Special Tests Leg LengthTest    Sacroiliac Tests  Sacral Thrust    Leg length test  Apparent      FABER test   findings Not tested    Comment Feels groin stretch on L      Slump test   Findings Positive    Side Left      Prone Knee Bend Test   Findings Negative    Comment Groin pain on L      Straight Leg Raise   Findings Positive    Side  Left    Comment Glute pain; ~120 deg      Sacral thrust    Findings Negative      Gaenslen's test   Findings Positive    Side  Left    Comments --   Pain with L leg off side of bed     Apparent   Comments Sup to long sit L LE shorter      [employer] Negative    Comments Stretch/groin pain      Ambulation/Gait   Gait Pattern Step-through pattern;Abducted - left                      Objective measurements completed on examination: See above findings.                  PT Short Term Goals - 07/31/20 1006      PT SHORT TERM GOAL #1   Title Pt will be independent with initial HEP    Time 4    Period Weeks    Status New    Target Date 08/28/20      PT SHORT TERM GOAL #2   Title Pt will have improved hamstring flexibility to at least 110 deg with SLR to demo increased hip muscle lengthening    Baseline ~120 deg on eval    Time 4    Period Weeks    Status New    Target Date 08/28/20      PT SHORT TERM GOAL #3   Title Pt will report at least 25% decrease in pain    Baseline Constant 5/10 pain    Time 4    Period Weeks    Status New    Target Date 08/28/20             PT Long Term Goals - 07/31/20 1008      PT LONG TERM GOAL #1   Title Pt will be independent with final HEP and transition to community wellness/gym    Time 8    Period Weeks    Status New    Target Date 09/25/20      PT LONG TERM GOAL #2   Title Pt will report decrease in pain to </=2/10    Time 8    Period Weeks    Status New    Target Date 09/25/20      PT LONG TERM GOAL #3   Title Pt will be able to return to squats with at least 25# to demo improved functional strength    Baseline Unable without pain    Time 8    Period Weeks    Status New    Target Date 09/25/20  Plan - 07/31/20 0959    Clinical Impression Statement Roy Bennett is a 55 y/o M presenting to OPPT with c/o chronic L hip pain. Pt with no known PMH. 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 Pt would benefit from physical therapy to optimize his level of function for improved comfort with sitting for occupational tasks.    Personal Factors and Comorbidities Age;Time since onset of injury/illness/exacerbation;Past/Current Experience    Examination-Participation Restrictions  Occupation;Driving    Stability/Clinical Decision Making Stable/Uncomplicated    Clinical Decision Making Low    Rehab Potential Good    PT Frequency 2x / week    PT Duration 8 weeks    PT Treatment/Interventions Aquatic Therapy;ADLs/Self Care Home Management;Cryotherapy;Electrical Stimulation;Moist Heat;Iontophoresis 35m/ml Dexamethasone;DME Instruction;Gait training;Stair training;Functional mobility training;Therapeutic activities;Therapeutic exercise;Balance training;Neuromuscular re-education;Manual techniques;Patient/family education;Passive range of motion;Dry needling;Taping;Vasopneumatic Device;Joint Manipulations;Spinal Manipulations    PT Next Visit Plan Assess response to HEP. Follow up on aquatics. MET for SI/pelvic mobility. Hip/pelvis stretching and strengthening.    PT Home Exercise Plan Access Code [email or web address]   Recommended Other Services Aquatics follow-up    Consulted and Agree with Plan of Care Patient           Patient will benefit from skilled therapeutic intervention in order to improve the following deficits and impairments:  Decreased coordination,Decreased range of motion,Increased fascial restricitons,Pain,Hypomobility,Impaired flexibility,Improper body mechanics,Decreased strength,Decreased mobility,Postural dysfunction  Visit Diagnosis: Muscle weakness (generalized)  Pain in left hip  Stiffness of left hip, not elsewhere classified  Abnormal posture     Problem List Patient Active Problem List   Diagnosis Date Noted  . Primary osteoarthritis of left hip 01/03/2020  . Chronic left hip pain 12/09/2019  . Pes planus of both feet 04/15/2014    Slate Debroux [name] L Kinta Martis PT, DPT 07/31/2020, 10:14 AM  [facility] [place] [address]Phone: [phone]  Fax:  [phone] Name: Roy KussMRN: [id number] of Birth: 109/16/1967

## 2020-08-05 ENCOUNTER — Ambulatory Visit (HOSPITAL_BASED_OUTPATIENT_CLINIC_OR_DEPARTMENT_OTHER): Payer: Commercial Managed Care - PPO | Attending: Family Medicine | Admitting: Physical Therapy

## 2020-08-05 ENCOUNTER — Other Ambulatory Visit: Payer: Self-pay

## 2020-08-05 DIAGNOSIS — R293 Abnormal posture: Secondary | ICD-10-CM | POA: Diagnosis not present

## 2020-08-05 DIAGNOSIS — M25552 Pain in left hip: Secondary | ICD-10-CM | POA: Diagnosis not present

## 2020-08-05 DIAGNOSIS — M25652 Stiffness of left hip, not elsewhere classified: Secondary | ICD-10-CM

## 2020-08-05 DIAGNOSIS — M6281 Muscle weakness (generalized): Secondary | ICD-10-CM

## 2020-08-05 NOTE — Therapy (Signed)
[facility] [address] [place], [place], [address] Phone: [phone]   Fax:  [phone]  Physical Therapy Treatment  Patient Details  Name: Roy Bennett MRN: [id number] Date of Birth: 17-Aug-1965 Referring Provider (PT): Gregor Hams, MD   Encounter Date: 08/05/2020   PT End of Session - 08/05/20 1019    Visit Number 2    Number of Visits 17    Date for PT Re-Evaluation 09/25/20    Authorization Type UHC    PT Start Time 0940    PT Stop Time 1020    PT Time Calculation (min) 40 min    Activity Tolerance Patient tolerated treatment well    Behavior During Therapy [facility] for tasks assessed/performed           No past medical history on file.  No past surgical history on file.  There were no vitals filed for this visit.   Subjective Assessment - 08/05/20 0944    Subjective Pt states he pulled something in his low back on the left side on Sunday. Pt states he felt sore after the exercises.    Patient is accompained by: Family member    Pertinent History n/a    Limitations Sitting    How long can you sit comfortably? ~1 minute    How long can you stand comfortably? ~10 minutes    How long can you walk comfortably? no issues -- pt reports he likes walking with left LE on low side of slant    Patient Stated Goals Would like to get regular exericise program for hip strengthening/conditioning; improve flexibility    Currently in Pain? Yes    Pain Score 4     Pain Location Hip    Pain Orientation Left    Pain Descriptors / Indicators Aching    Pain Type Chronic pain;Acute pain    Pain Onset More than a month ago                             [facility] Adult PT Treatment/Exercise - 08/05/20 0001      Lumbar Exercises: Stretches   Piriformis Stretch 30 seconds;Right;Left      Lumbar Exercises: Standing   Wall Slides 10 reps    Wall Slides Limitations With PPT    Other Standing Lumbar Exercises PPT  against wall x10      Lumbar Exercises: Supine   Clam 20 reps    Clam Limitations burgundy tband    Bridge 10 reps      Knee/Hip Exercises: Stretches   Hip Flexor Stretch Right;Left;20 seconds      Knee/Hip Exercises: Seated   Other Seated Knee/Hip Exercises Sitting on fitter: lateral pelvic tilt 2x5 reps; attempted hip flexion but increased symptoms; A/P pelvic tilts x10; LAQ x10 bilat      Manual Therapy   Manual Therapy Muscle Energy Technique    Muscle Energy Technique SI joint correction in 90/90                    PT Short Term Goals - 07/31/20 1006      PT SHORT TERM GOAL #1   Title Pt will be independent with initial HEP    Time 4    Period Weeks    Status New    Target Date 08/28/20      PT SHORT TERM GOAL #2   Title Pt will have improved hamstring flexibility  to at least 110 deg with SLR to demo increased hip muscle lengthening    Baseline ~120 deg on eval    Time 4    Period Weeks    Status New    Target Date 08/28/20      PT SHORT TERM GOAL #3   Title Pt will report at least 25% decrease in pain    Baseline Constant 5/10 pain    Time 4    Period Weeks    Status New    Target Date 08/28/20             PT Long Term Goals - 07/31/20 1008      PT LONG TERM GOAL #1   Title Pt will be independent with final HEP and transition to community wellness/gym    Time 8    Period Weeks    Status New    Target Date 09/25/20      PT LONG TERM GOAL #2   Title Pt will report decrease in pain to </=2/10    Time 8    Period Weeks    Status New    Target Date 09/25/20      PT LONG TERM GOAL #3   Title Pt will be able to return to squats with at least 25# to demo improved functional strength    Baseline Unable without pain    Time 8    Period Weeks    Status New    Target Date 09/25/20                 Plan - 08/05/20 1026    Clinical Impression Statement Treatment session focused on improving pelvic stability and alignment with manual  and strengthening exercises. Progressed pt to higher resistance band. Provided stretches to address hip & lumbar tightness.    Personal Factors and Comorbidities Age;Time since onset of injury/illness/exacerbation;Past/Current Experience    Examination-Participation Restrictions Occupation;Driving    Stability/Clinical Decision Making Stable/Uncomplicated    Rehab Potential Good    PT Frequency 2x / week    PT Duration 8 weeks    PT Treatment/Interventions Aquatic Therapy;ADLs/Self Care Home Management;Cryotherapy;Electrical Stimulation;Moist Heat;Iontophoresis 55m/ml Dexamethasone;DME Instruction;Gait training;Stair training;Functional mobility training;Therapeutic activities;Therapeutic exercise;Balance training;Neuromuscular re-education;Manual techniques;Patient/family education;Passive range of motion;Dry needling;Taping;Vasopneumatic Device;Joint Manipulations;Spinal Manipulations    PT Next Visit Plan Assess response to HEP. Follow up on aquatics. MET for SI/pelvic mobility. Hip/pelvis stretching and strengthening.    PT Home Exercise Plan Access Code [email or web address]   Consulted and Agree with Plan of Care Patient           Patient will benefit from skilled therapeutic intervention in order to improve the following deficits and impairments:  Decreased coordination,Decreased range of motion,Increased fascial restricitons,Pain,Hypomobility,Impaired flexibility,Improper body mechanics,Decreased strength,Decreased mobility,Postural dysfunction  Visit Diagnosis: Muscle weakness (generalized)  Pain in left hip  Stiffness of left hip, not elsewhere classified  Abnormal posture     Problem List Patient Active Problem List   Diagnosis Date Noted  . Primary osteoarthritis of left hip 01/03/2020  . Chronic left hip pain 12/09/2019  . Pes planus of both feet 04/15/2014    Roy Bennett [name] L NCampbellPT, DPT 08/05/2020, 10:27 AM  [facility] [address]Phone: [phone]  Fax:  [phone] Name: Roy IserMRN: [id number] of Birth: [birth year]

## 2020-08-11 ENCOUNTER — Other Ambulatory Visit: Payer: Self-pay

## 2020-08-11 ENCOUNTER — Ambulatory Visit (HOSPITAL_BASED_OUTPATIENT_CLINIC_OR_DEPARTMENT_OTHER): Payer: Commercial Managed Care - PPO | Attending: Family Medicine | Admitting: Physical Therapy

## 2020-08-11 DIAGNOSIS — M25652 Stiffness of left hip, not elsewhere classified: Secondary | ICD-10-CM | POA: Diagnosis present

## 2020-08-11 DIAGNOSIS — R293 Abnormal posture: Secondary | ICD-10-CM | POA: Diagnosis present

## 2020-08-11 DIAGNOSIS — M6281 Muscle weakness (generalized): Secondary | ICD-10-CM

## 2020-08-11 DIAGNOSIS — M25552 Pain in left hip: Secondary | ICD-10-CM | POA: Diagnosis present

## 2020-08-11 NOTE — Therapy (Signed)
[facility] [address] [place], [place], [address] Phone: [phone]   Fax:  [phone]  Physical Therapy Treatment  Patient Details  Name: Roy Bennett MRN: [id number] Date of Birth: 10/30/1965 Referring Provider (PT): Gregor Hams, MD   Encounter Date: 08/11/2020   PT End of Session - 08/11/20 0931    Visit Number 3    Number of Visits 17    Date for PT Re-Evaluation 09/25/20    Authorization Type UHC    PT Start Time 0845    PT Stop Time 0930    PT Time Calculation (min) 45 min    Activity Tolerance Patient tolerated treatment well    Behavior During Therapy [facility] for tasks assessed/performed           No past medical history on file.  No past surgical history on file.  There were no vitals filed for this visit.   Subjective Assessment - 08/11/20 0849    Subjective Pt states he's been going into the pool and exercising in there. Pt reports pain is manageable. Pt has been doing his exercises at home.    Patient is accompained by: Family member    Pertinent History n/a    Limitations Sitting    How long can you sit comfortably? ~1 minute    How long can you stand comfortably? ~10 minutes    How long can you walk comfortably? no issues -- pt reports he likes walking with left LE on low side of slant    Patient Stated Goals Would like to get regular exericise program for hip strengthening/conditioning; improve flexibility    Currently in Pain? Yes    Pain Score 6     Pain Location Hip    Pain Orientation Left    Pain Descriptors / Indicators Aching    Pain Onset More than a month ago                             [facility] Adult PT Treatment/Exercise - 08/11/20 0001      Ambulation/Gait   Ambulation Distance (Feet) 40 Feet    Assistive device None    Gait Comments Cues for narrowing BOS, decreasing L hip hike with L LE swing through and improved pelvic symmetry with gait   Pt reports pain  with L LE extended during R LE swing through phase     Lumbar Exercises: Stretches   Passive Hamstring Stretch Left;30 seconds;Right    Figure 4 Stretch 30 seconds;Seated    Figure 4 Stretch Limitations --   Tighter on left     Lumbar Exercises: Standing   Other Standing Lumbar Exercises Modified forward plank 2x20 sec; modified side plank with raised plinth 2x20 sec    Other Standing Lumbar Exercises Modified downward dog 2x20 sec      Knee/Hip Exercises: Stretches   Gastroc Stretch Both;30 seconds    Soleus Stretch Both;30 seconds      Knee/Hip Exercises: Machines for Strengthening   Cybex Leg Press 70# double leg press   with PPT     Knee/Hip Exercises: Standing   Other Standing Knee Exercises Pelvic tilts x10 bilat                    PT Short Term Goals - 07/31/20 1006      PT SHORT TERM GOAL #1   Title Pt will be independent with initial HEP  Time 4    Period Weeks    Status New    Target Date 08/28/20      PT SHORT TERM GOAL #2   Title Pt will have improved hamstring flexibility to at least 110 deg with SLR to demo increased hip muscle lengthening    Baseline ~120 deg on eval    Time 4    Period Weeks    Status New    Target Date 08/28/20      PT SHORT TERM GOAL #3   Title Pt will report at least 25% decrease in pain    Baseline Constant 5/10 pain    Time 4    Period Weeks    Status New    Target Date 08/28/20             PT Long Term Goals - 07/31/20 1008      PT LONG TERM GOAL #1   Title Pt will be independent with final HEP and transition to community wellness/gym    Time 8    Period Weeks    Status New    Target Date 09/25/20      PT LONG TERM GOAL #2   Title Pt will report decrease in pain to </=2/10    Time 8    Period Weeks    Status New    Target Date 09/25/20      PT LONG TERM GOAL #3   Title Pt will be able to return to squats with at least 25# to demo improved functional strength    Baseline Unable without pain     Time 8    Period Weeks    Status New    Target Date 09/25/20                 Plan - 08/11/20 1317    Clinical Impression Statement Treatment focused on continued progression of pelvic stability and addressing L hip tightness. Pt with improved hip symmetry today. Gait training provided to decrease hip hiking/hip asymmetry during amb. Pt with L anterior hip pain during R LE swing through which may need further investigation.    Personal Factors and Comorbidities Age;Time since onset of injury/illness/exacerbation;Past/Current Experience    Examination-Participation Restrictions Occupation;Driving    Stability/Clinical Decision Making Stable/Uncomplicated    Rehab Potential Good    PT Frequency 2x / week    PT Duration 8 weeks    PT Treatment/Interventions Aquatic Therapy;ADLs/Self Care Home Management;Cryotherapy;Electrical Stimulation;Moist Heat;Iontophoresis 67m/ml Dexamethasone;DME Instruction;Gait training;Stair training;Functional mobility training;Therapeutic activities;Therapeutic exercise;Balance training;Neuromuscular re-education;Manual techniques;Patient/family education;Passive range of motion;Dry needling;Taping;Vasopneumatic Device;Joint Manipulations;Spinal Manipulations    PT Next Visit Plan Assess response to HEP. MET for SI/pelvic mobility as needed. Hip/pelvis stretching and strengthening.    PT Home Exercise Plan Access Code [email or web address]   Consulted and Agree with Plan of Care Patient           Patient will benefit from skilled therapeutic intervention in order to improve the following deficits and impairments:  Decreased coordination,Decreased range of motion,Increased fascial restricitons,Pain,Hypomobility,Impaired flexibility,Improper body mechanics,Decreased strength,Decreased mobility,Postural dysfunction  Visit Diagnosis: Muscle weakness (generalized)  Pain in left hip  Stiffness of left hip, not elsewhere classified  Abnormal posture     Problem  List Patient Active Problem List   Diagnosis Date Noted  . Primary osteoarthritis of left hip 01/03/2020  . Chronic left hip pain 12/09/2019  . Pes planus of both feet 04/15/2014    Shaniya Tashiro [name] L Ryna Beckstrom PT, DPT 08/11/2020, 1:30  PM  [facility] [address] [place], [place], [address] Phone: [phone]   Fax:  [phone]  Name: Roy Bennett MRN: [id number] Date of Birth: 1966/03/10

## 2020-08-14 ENCOUNTER — Ambulatory Visit (HOSPITAL_BASED_OUTPATIENT_CLINIC_OR_DEPARTMENT_OTHER): Payer: Commercial Managed Care - PPO | Admitting: Physical Therapy

## 2020-08-14 ENCOUNTER — Encounter (HOSPITAL_BASED_OUTPATIENT_CLINIC_OR_DEPARTMENT_OTHER): Payer: Self-pay | Admitting: Physical Therapy

## 2020-08-14 ENCOUNTER — Other Ambulatory Visit: Payer: Self-pay

## 2020-08-14 DIAGNOSIS — M25552 Pain in left hip: Secondary | ICD-10-CM

## 2020-08-14 DIAGNOSIS — M25652 Stiffness of left hip, not elsewhere classified: Secondary | ICD-10-CM

## 2020-08-14 DIAGNOSIS — M6281 Muscle weakness (generalized): Secondary | ICD-10-CM | POA: Diagnosis not present

## 2020-08-14 DIAGNOSIS — R293 Abnormal posture: Secondary | ICD-10-CM

## 2020-08-14 NOTE — Therapy (Signed)
[facility] [address] [place], [place], [address] Phone: [phone]   Fax:  [phone]  Physical Therapy Treatment  Patient Details  Name: Roy Bennett MRN: [id number] Date of Birth: 08-30-1965 Referring Provider (PT): Gregor Hams, MD   Encounter Date: 08/14/2020   PT End of Session - 08/14/20 1642    Visit Number 4    Number of Visits 17    Date for PT Re-Evaluation 09/25/20    Authorization Type UHC    PT Start Time 1400    PT Stop Time 1443    PT Time Calculation (min) 43 min    Activity Tolerance Patient tolerated treatment well    Behavior During Therapy [facility] for tasks assessed/performed           History reviewed. No pertinent past medical history.  History reviewed. No pertinent surgical history.  There were no vitals filed for this visit.   Subjective Assessment - 08/14/20 1646    Subjective Pain is improving over last 10 days. Has gotten In Pool 6x over last 2 wks. Pt complains of continued tightness in hip.    Currently in Pain? Yes    Pain Score 4     Pain Location Hip    Pain Orientation Left;Anterior;Lateral    Pain Descriptors / Indicators Aching;Tightness    Aggravating Factors  sitting    Pain Relieving Factors walking and staying active           Pt seen for aquatic therapy today.  Treatment took place in water 3.5-4.5 ft in depth at the [facility]. Temp of water was 91 deg.  Pt entered/exited the pool via stairs with unilateral rail, independently.  Treatment:   Backward walking x 30 ft Hip ext x 10 Hip circles with flexed knee (fire hydrant to/from forward hip flexion) x 10 each direction, each leg Hip adductor stretch x 20 sec each side x 2 reps ITB stretch LLE x 3, then curtsy to knee to noodle. X 8 Each leg Forward float to tuck x 5 reps Side step squat 18 ft RT/ LT  Pt requires buoyancy for support and to offload joints with strengthening exercises. Viscosity of the  water is needed for resistance of strengthening; water current perturbations provides challenge to standing balance unsupported, requiring increased core activation.     PT Short Term Goals - 07/31/20 1006      PT SHORT TERM GOAL #1   Title Pt will be independent with initial HEP    Time 4    Period Weeks    Status New    Target Date 08/28/20      PT SHORT TERM GOAL #2   Title Pt will have improved hamstring flexibility to at least 110 deg with SLR to demo increased hip muscle lengthening    Baseline ~120 deg on eval    Time 4    Period Weeks    Status New    Target Date 08/28/20      PT SHORT TERM GOAL #3   Title Pt will report at least 25% decrease in pain    Baseline Constant 5/10 pain    Time 4    Period Weeks    Status New    Target Date 08/28/20             PT Long Term Goals - 07/31/20 1008      PT LONG TERM GOAL #1   Title Pt will be independent with final  HEP and transition to community wellness/gym    Time 8    Period Weeks    Status New    Target Date 09/25/20      PT LONG TERM GOAL #2   Title Pt will report decrease in pain to </=2/10    Time 8    Period Weeks    Status New    Target Date 09/25/20      PT LONG TERM GOAL #3   Title Pt will be able to return to squats with at least 25# to demo improved functional strength    Baseline Unable without pain    Time 8    Period Weeks    Status New    Target Date 09/25/20                 Plan - 08/14/20 1647    Clinical Impression Statement Pt tolerated aquatic therapy well, reporting a decrease in Lt hip pain to 2/10 by end of session and reduced tightness.  He reported clicking in Lt hip with circumduction motions and tightness in Rt adductors.  Intermittent cues given to dial back intensity of stretch or exercise so as to not exacerbate his symptoms. Pt reported most relief from standing Lt hip flexor stretch (with cues for post pelvic tilt), and ITB stretch in curtsey position. Pt  progressing well towards goals.    Personal Factors and Comorbidities Age;Time since onset of injury/illness/exacerbation;Past/Current Experience    Examination-Participation Restrictions Occupation;Driving    Stability/Clinical Decision Making Stable/Uncomplicated    Rehab Potential Good    PT Frequency 2x / week    PT Duration 8 weeks    PT Treatment/Interventions Aquatic Therapy;ADLs/Self Care Home Management;Cryotherapy;Electrical Stimulation;Moist Heat;Iontophoresis 4mg /ml Dexamethasone;DME Instruction;Gait training;Stair training;Functional mobility training;Therapeutic activities;Therapeutic exercise;Balance training;Neuromuscular re-education;Manual techniques;Patient/family education;Passive range of motion;Dry needling;Taping;Vasopneumatic Device;Joint Manipulations;Spinal Manipulations    PT Next Visit Plan aquatic session:  continue hip/ LE stretching and strengthening with core activation.    PT Home Exercise Plan Access Code [email or web address]    Consulted and Agree with Plan of Care Patient           Patient will benefit from skilled therapeutic intervention in order to improve the following deficits and impairments:  Decreased coordination,Decreased range of motion,Increased fascial restricitons,Pain,Hypomobility,Impaired flexibility,Improper body mechanics,Decreased strength,Decreased mobility,Postural dysfunction  Visit Diagnosis: Muscle weakness (generalized)  Pain in left hip  Stiffness of left hip, not elsewhere classified  Abnormal posture     Problem List Patient Active Problem List   Diagnosis Date Noted  . Primary osteoarthritis of left hip 01/03/2020  . Chronic left hip pain 12/09/2019  . Pes planus of both feet 04/15/2014   Kerin Perna, PTA 08/14/20 4:57 PM  [facility] [address] [place], [place], [address] Phone: [phone]   Fax:  [phone]  Name: Roy Bennett MRN: [id number] Date  of Birth: 13-Feb-1966

## 2020-08-18 ENCOUNTER — Ambulatory Visit (HOSPITAL_BASED_OUTPATIENT_CLINIC_OR_DEPARTMENT_OTHER): Payer: Commercial Managed Care - PPO | Admitting: Physical Therapy

## 2020-08-21 ENCOUNTER — Ambulatory Visit (HOSPITAL_BASED_OUTPATIENT_CLINIC_OR_DEPARTMENT_OTHER): Payer: Commercial Managed Care - PPO | Admitting: Physical Therapy

## 2020-08-25 ENCOUNTER — Ambulatory Visit (HOSPITAL_BASED_OUTPATIENT_CLINIC_OR_DEPARTMENT_OTHER): Payer: Commercial Managed Care - PPO | Attending: Family Medicine | Admitting: Physical Therapy

## 2020-08-25 DIAGNOSIS — M6281 Muscle weakness (generalized): Secondary | ICD-10-CM | POA: Insufficient documentation

## 2020-08-25 DIAGNOSIS — M25652 Stiffness of left hip, not elsewhere classified: Secondary | ICD-10-CM | POA: Insufficient documentation

## 2020-08-25 DIAGNOSIS — R293 Abnormal posture: Secondary | ICD-10-CM | POA: Insufficient documentation

## 2020-08-25 DIAGNOSIS — M25552 Pain in left hip: Secondary | ICD-10-CM | POA: Insufficient documentation

## 2020-08-28 ENCOUNTER — Ambulatory Visit (HOSPITAL_BASED_OUTPATIENT_CLINIC_OR_DEPARTMENT_OTHER): Payer: Commercial Managed Care - PPO | Admitting: Physical Therapy

## 2020-09-01 ENCOUNTER — Other Ambulatory Visit: Payer: Self-pay | Admitting: Internal Medicine

## 2020-09-01 ENCOUNTER — Ambulatory Visit (HOSPITAL_BASED_OUTPATIENT_CLINIC_OR_DEPARTMENT_OTHER): Payer: Commercial Managed Care - PPO | Admitting: Physical Therapy

## 2020-09-01 DIAGNOSIS — M791 Myalgia, unspecified site: Secondary | ICD-10-CM

## 2020-09-04 ENCOUNTER — Ambulatory Visit (HOSPITAL_BASED_OUTPATIENT_CLINIC_OR_DEPARTMENT_OTHER): Payer: Commercial Managed Care - PPO | Admitting: Physical Therapy

## 2020-09-07 NOTE — Progress Notes (Signed)
   I, Roy Bennett, LAT, ATC, am serving as scribe for Dr. Lynne Leader.  Roy Bennett is a 55 y.o. male who presents to [place] at [facility] today for R elbow pain and B hand numbness.  He was last seen by Dr. Georgina Snell on 11/25/19 for his L hip.  Today, pt reports new R elbow pain since the summer w/ no known MOI.  He locates his pain to his R elbow at the olecranon and R lateral epicondyle.  He also notes L medial knee pain and R metatarsalgia (distal)  / possible neuroma that makes him feel like he has a rock in his shoe.  Radiating pain: No Neck pain: not really R elbow swelling: No Aggravating factors: shaving his face; brushing his teeth; phone use esp w/ elbows flexed Treatments tried: Nothing   Pertinent review of systems: No fevers or chills  Relevant historical information: Right hip arthritis   Exam:  BP 122/82 (BP Location: Left Arm, Patient Position: Sitting, Cuff Size: Large)   Pulse 64   Ht 6\' 5"  (1.956 m)   Wt 276 lb 3.2 oz (125.3 kg)   SpO2 98%   BMI 32.75 kg/m  General: Well Developed, well nourished, and in no acute distress.   MSK: C-spine normal-appearing normal cervical motion.  Negative Spurling's test.  Upper extremity strength is intact.  Right elbow normal-appearing Normal motion. Normal strength. Tender palpation lateral epicondyles.  Pain with resisted wrist and finger extension.  Right hand and wrist normal-appearing normal motion minimally positive Tinel's right wrist.  Minimally positive Phalen's test right hand and wrist.  Right foot normal-appearing tender palpation second and third plantar metatarsal heads.  Negative metatarsal squeeze test.    Lab and Radiology Results  [facility] MSK Ultrasound of: Right carpal tunnel and right lateral elbow Carpal tunnel: Median nerve visualized.  Measures 16 millimeters squared cross-sectional area.  Pressure over this area does tend to cause paresthesias distally.  Letter  epicondyle visualized.  Avulsion fleck present superficially consistent with lateral epicondylitis.  No tear visible.  Impression:  Right mild carpal tunnel syndrome. Lateral epicondylitis right elbow      Assessment and Plan: 55 y.o. male with  Right lateral epicondylitis.  Plan for eccentric exercises taught in clinic today by ATC.  Right hand paresthesias very likely carpal tunnel syndrome.  Possible cervical radiculopathy but less likely.  Recommend carpal tunnel splint at night and gabapentin.  Foot plantar pain thought to be metatarsalgia.  Morton's neuroma is a possibility but less likely.  Plan for metatarsal pads.  Recheck in 1 month.   PDMP not reviewed this encounter. Orders Placed This Encounter  Procedures  . [place] LIMITED JOINT SPACE STRUCTURES UP RIGHT(NO LINKED CHARGES)    Order Specific Question:   Reason for Exam (SYMPTOM  OR DIAGNOSIS REQUIRED)    Answer:   R elbow pain    Order Specific Question:   Preferred imaging location?    Answer:   [facility]   Meds ordered this encounter  Medications  . gabapentin (NEURONTIN) 300 MG capsule    Sig: Take 1 capsule (300 mg total) by mouth 3 (three) times daily as needed.    Dispense:  90 capsule    Refill:  3     Discussed warning signs or symptoms. Please see discharge instructions. Patient expresses understanding.   The above documentation has been reviewed and is accurate and complete Lynne Leader, M.D.

## 2020-09-08 ENCOUNTER — Ambulatory Visit (HOSPITAL_BASED_OUTPATIENT_CLINIC_OR_DEPARTMENT_OTHER): Payer: Commercial Managed Care - PPO | Admitting: Physical Therapy

## 2020-09-08 ENCOUNTER — Ambulatory Visit (INDEPENDENT_AMBULATORY_CARE_PROVIDER_SITE_OTHER): Payer: Commercial Managed Care - PPO | Admitting: Family Medicine

## 2020-09-08 ENCOUNTER — Ambulatory Visit: Payer: Self-pay

## 2020-09-08 ENCOUNTER — Encounter: Payer: Self-pay | Admitting: Family Medicine

## 2020-09-08 ENCOUNTER — Other Ambulatory Visit: Payer: Self-pay

## 2020-09-08 VITALS — BP 122/82 | HR 64 | Ht 77.0 in | Wt 276.2 lb

## 2020-09-08 DIAGNOSIS — M7711 Lateral epicondylitis, right elbow: Secondary | ICD-10-CM

## 2020-09-08 DIAGNOSIS — G5601 Carpal tunnel syndrome, right upper limb: Secondary | ICD-10-CM

## 2020-09-08 DIAGNOSIS — M6281 Muscle weakness (generalized): Secondary | ICD-10-CM

## 2020-09-08 DIAGNOSIS — M25521 Pain in right elbow: Secondary | ICD-10-CM

## 2020-09-08 DIAGNOSIS — M7741 Metatarsalgia, right foot: Secondary | ICD-10-CM | POA: Diagnosis not present

## 2020-09-08 DIAGNOSIS — R293 Abnormal posture: Secondary | ICD-10-CM

## 2020-09-08 DIAGNOSIS — M25652 Stiffness of left hip, not elsewhere classified: Secondary | ICD-10-CM | POA: Diagnosis present

## 2020-09-08 DIAGNOSIS — M25552 Pain in left hip: Secondary | ICD-10-CM

## 2020-09-08 MED ORDER — GABAPENTIN 300 MG PO CAPS: 300.0000 mg | ORAL_CAPSULE | Freq: Three times a day (TID) | ORAL | 3 refills | Status: DC | PRN

## 2020-09-08 NOTE — Patient Instructions (Addendum)
Thank you for coming in today.  Use the metatarsal pads. From [employer]. Size medium or large.  Dx  Metatarsalgia.   I think you probably have carpal tunnel syndrome.  Use carpal tunnel wrist brace at night.  Also take gabapentin mostly at night as needed.   You also have tennis elbow. Do the exercises we reviewed.   Please perform the exercise program that we have prepared for you and gone over in detail on a daily basis.  In addition to the handout you were provided you can access your program through: [email or web address]   Your unique program code is: [email or web address]   Recheck in 1 month.    Theraband flexbar can help a lot

## 2020-09-08 NOTE — Therapy (Signed)
[facility] [address] [place], [place], [address] Phone: [phone]   Fax:  [phone]  Physical Therapy Treatment  Patient Details  Name: Roy Bennett MRN: [id number] Date of Birth: 12/22/1965 Referring Provider (PT): Gregor Hams, MD   Encounter Date: 09/08/2020   PT End of Session - 09/08/20 0913    Visit Number 5    Number of Visits 17    Date for PT Re-Evaluation 09/25/20    Authorization Type UHC    PT Start Time 0845    PT Stop Time 0930    PT Time Calculation (min) 45 min    Activity Tolerance Patient tolerated treatment well    Behavior During Therapy [facility] for tasks assessed/performed           No past medical history on file.  No past surgical history on file.  There were no vitals filed for this visit.   Subjective Assessment - 09/08/20 0844    Subjective Pt states hip pain had worsened since Friday night requiring 4 advils with limited relief. (they had driven to [place] and walked around [employer]). Pt reports he has done the exercises 3-4 times in the past week and twice in the pool with limited relief. Pt had been sick for a week earlier this month and had not been able to perform exercises.    Pertinent History n/a    Limitations Sitting    How long can you sit comfortably? ~1 minute    How long can you stand comfortably? ~10 minutes    How long can you walk comfortably? no issues -- pt reports he likes walking with left LE on low side of slant    Patient Stated Goals Would like to get regular exericise program for hip strengthening/conditioning; improve flexibility    Currently in Pain? Yes    Pain Score 5     Pain Location Hip             Manual therapy:  Bilat hip grade III mobilization for flexion, distraction, and ER  PROM for hip flexion stretch bilat    Supine therex:  Piriformis stretch bilat x40 sec  Figure 4 stretch bilat x40 sec  Hip flexor stretch bilat  x40 sec  Single knee to chest x10 sec   Traction table max 120 lbs, min 80 lbs for 10 min                      PT Education - 09/08/20 0930    Education Details Discussed joint stretching and neural flossing    Person(s) Educated Patient    Methods Explanation;Verbal cues;Handout    Comprehension Verbalized understanding;Returned demonstration            PT Short Term Goals - 09/08/20 0930      PT SHORT TERM GOAL #1   Title Pt will be independent with initial HEP    Time 4    Period Weeks    Status Achieved    Target Date 08/28/20      PT SHORT TERM GOAL #2   Title Pt will have improved hamstring flexibility to at least 110 deg with SLR to demo increased hip muscle lengthening    Baseline ~120 deg on eval    Time 4    Period Weeks    Status Achieved    Target Date 08/28/20      PT SHORT TERM GOAL #3   Title Pt will  report at least 25% decrease in pain    Baseline Constant 5/10 pain    Time 4    Period Weeks    Status Partially Met    Target Date 08/28/20             PT Long Term Goals - 07/31/20 1008      PT LONG TERM GOAL #1   Title Pt will be independent with final HEP and transition to community wellness/gym    Time 8    Period Weeks    Status New    Target Date 09/25/20      PT LONG TERM GOAL #2   Title Pt will report decrease in pain to </=2/10    Time 8    Period Weeks    Status New    Target Date 09/25/20      PT LONG TERM GOAL #3   Title Pt will be able to return to squats with at least 25# to demo improved functional strength    Baseline Unable without pain    Time 8    Period Weeks    Status New    Target Date 09/25/20                 Plan - 09/08/20 0926    Clinical Impression Statement Pt with increased sciatic symptoms and hip pain on presentation. Improved hip pain after end of session. Pt demos increased bilat hip joint hypomobility. Performed manual therapy, neural mobilization and stretches  accordingly. Traction provided for low back. Not enough time this session to continue strengthening.    Personal Factors and Comorbidities Age;Time since onset of injury/illness/exacerbation;Past/Current Experience    Examination-Participation Restrictions Occupation;Driving    Stability/Clinical Decision Making Stable/Uncomplicated    Rehab Potential Good    PT Frequency 2x / week    PT Duration 8 weeks    PT Treatment/Interventions Aquatic Therapy;ADLs/Self Care Home Management;Cryotherapy;Electrical Stimulation;Moist Heat;Iontophoresis 42m/ml Dexamethasone;DME Instruction;Gait training;Stair training;Functional mobility training;Therapeutic activities;Therapeutic exercise;Balance training;Neuromuscular re-education;Manual techniques;Patient/family education;Passive range of motion;Dry needling;Taping;Vasopneumatic Device;Joint Manipulations;Spinal Manipulations    PT Next Visit Plan Continue hip and LE stretching, strengthening with core activation.    PT Home Exercise Plan Access Code [email or web address]   Consulted and Agree with Plan of Care Patient           Patient will benefit from skilled therapeutic intervention in order to improve the following deficits and impairments:  Decreased coordination,Decreased range of motion,Increased fascial restricitons,Pain,Hypomobility,Impaired flexibility,Improper body mechanics,Decreased strength,Decreased mobility,Postural dysfunction  Visit Diagnosis: Muscle weakness (generalized)  Pain in left hip  Stiffness of left hip, not elsewhere classified  Abnormal posture     Problem List Patient Active Problem List   Diagnosis Date Noted  . Primary osteoarthritis of left hip 01/03/2020  . Chronic left hip pain 12/09/2019  . Pes planus of both feet 04/15/2014    Roy Bennett PT, DPT 09/08/2020, 9:31 AM  [facility] [address]Phone: [phone]  Fax:   [phone] Name: Roy MchanMRN: [id number] of Birth: [birth year]

## 2020-09-11 ENCOUNTER — Encounter (HOSPITAL_BASED_OUTPATIENT_CLINIC_OR_DEPARTMENT_OTHER): Payer: Self-pay | Admitting: Physical Therapy

## 2020-09-11 ENCOUNTER — Other Ambulatory Visit: Payer: Self-pay

## 2020-09-11 ENCOUNTER — Ambulatory Visit (HOSPITAL_BASED_OUTPATIENT_CLINIC_OR_DEPARTMENT_OTHER): Payer: Commercial Managed Care - PPO | Admitting: Physical Therapy

## 2020-09-11 DIAGNOSIS — R293 Abnormal posture: Secondary | ICD-10-CM

## 2020-09-11 DIAGNOSIS — M25552 Pain in left hip: Secondary | ICD-10-CM

## 2020-09-11 DIAGNOSIS — M25652 Stiffness of left hip, not elsewhere classified: Secondary | ICD-10-CM

## 2020-09-11 DIAGNOSIS — M6281 Muscle weakness (generalized): Secondary | ICD-10-CM

## 2020-09-11 NOTE — Therapy (Signed)
[facility] [address] [place], [place], [address] Phone: [phone]   Fax:  [phone]  Physical Therapy Treatment  Patient Details  Name: Roy Bennett MRN: [id number] Date of Birth: [birth year] Referring Provider (PT): Gregor Hams, MD   Encounter Date: 09/11/2020   PT End of Session - 09/11/20 1555    Visit Number 6    Number of Visits 17    Date for PT Re-Evaluation 09/25/20    Authorization Type UHC    PT Start Time 1740    PT Stop Time 1445    PT Time Calculation (min) 47 min    Activity Tolerance Patient tolerated treatment well    Behavior During Therapy [facility] for tasks assessed/performed           History reviewed. No pertinent past medical history.  History reviewed. No pertinent surgical history.  There were no vitals filed for this visit.   Subjective Assessment - 09/11/20 1556    Subjective Pt reports "massive" relief in Lt hip during last treatment with PT. Relief has continued some since then.    Pertinent History n/a    Limitations Sitting    How long can you sit comfortably? ~1 minute    How long can you stand comfortably? ~10 minutes    How long can you walk comfortably? no issues -- pt reports he likes walking with left LE on low side of slant    Patient Stated Goals Would like to get regular exericise program for hip strengthening/conditioning; improve flexibility    Currently in Pain? Yes    Pain Score 5     Pain Location Hip    Pain Orientation Left;Anterior    Pain Descriptors / Indicators Aching;Tightness    Aggravating Factors  sitting, driving    Pain Relieving Factors walking, stretches           Pt seen for aquatic therapy today.  Treatment took place in water 3.25-4 ft in depth at the [facility] pool. Temp of water was 91.  Pt entered/exited the pool via stairs with bilat rail independently.  Treatment:   Side stepping, retro gait Holding on to pool: Hip flexor  stretch Quad stretch with leg supported by noodle x 60 sec, then dynamic stretch to hamstring with forward leans x 8 reps. Leg supported by noodle at ankle, moving LE into addiction/abduction for adductor and ITB stretch Leg press in flexion and abdct, pressing noodle down and squeezing glute, core engaged. Curtsy to hip abdct (knee flexed) x 5 each side with overhead reach Hurdle walk with sweeping arms each side.(painful and "pinching"-stopped) Fire hydrant arches x 8 each Sit to/from stand at bench in water x 10 with foot lift and core engaged followed by trunk ext once upright.   Pt requires buoyancy for support and to offload joints with strengthening exercises. Viscosity of the water is needed for resistance of strengthening; water current perturbations provides challenge to standing balance unsupported, requiring increased core activation.     PT Short Term Goals - 09/08/20 0930      PT SHORT TERM GOAL #1   Title Pt will be independent with initial HEP    Time 4    Period Weeks    Status Achieved    Target Date 08/28/20      PT SHORT TERM GOAL #2   Title Pt will have improved hamstring flexibility to at least 110 deg with SLR to demo increased hip muscle lengthening  Baseline ~120 deg on eval    Time 4    Period Weeks    Status Achieved    Target Date 08/28/20      PT SHORT TERM GOAL #3   Title Pt will report at least 25% decrease in pain    Baseline Constant 5/10 pain    Time 4    Period Weeks    Status Partially Met    Target Date 08/28/20             PT Long Term Goals - 07/31/20 1008      PT LONG TERM GOAL #1   Title Pt will be independent with final HEP and transition to community wellness/gym    Time 8    Period Weeks    Status New    Target Date 09/25/20      PT LONG TERM GOAL #2   Title Pt will report decrease in pain to </=2/10    Time 8    Period Weeks    Status New    Target Date 09/25/20      PT LONG TERM GOAL #3   Title Pt will be  able to return to squats with at least 25# to demo improved functional strength    Baseline Unable without pain    Time 8    Period Weeks    Status New    Target Date 09/25/20                 Plan - 09/11/20 1559    Clinical Impression Statement Pt reported elimination of Lt hip pain after standing quad/hamstring stretches. Remained pain free for remainder of session. He reported some "pinching" in Rt hip with hurdle motion; stopped and sensation resolved. Positive response to aquatic exercises. Will benefit from additional sessions to establish HEP. Encouraged pt to use lumbar pillow in car/seat for improved posture and decreased pain.   Personal Factors and Comorbidities Age;Time since onset of injury/illness/exacerbation;Past/Current Experience    Examination-Participation Restrictions Occupation;Driving    Stability/Clinical Decision Making Stable/Uncomplicated    Rehab Potential Good    PT Frequency 2x / week    PT Duration 8 weeks    PT Treatment/Interventions Aquatic Therapy;ADLs/Self Care Home Management;Cryotherapy;Electrical Stimulation;Moist Heat;Iontophoresis 12m/ml Dexamethasone;DME Instruction;Gait training;Stair training;Functional mobility training;Therapeutic activities;Therapeutic exercise;Balance training;Neuromuscular re-education;Manual techniques;Patient/family education;Passive range of motion;Dry needling;Taping;Vasopneumatic Device;Joint Manipulations;Spinal Manipulations    PT Next Visit Plan Continue hip and LE stretching, strengthening with core activation.    PT Home Exercise Plan Access Code [email or web address]   Consulted and Agree with Plan of Care Patient           Patient will benefit from skilled therapeutic intervention in order to improve the following deficits and impairments:  Decreased coordination,Decreased range of motion,Increased fascial restricitons,Pain,Hypomobility,Impaired flexibility,Improper body mechanics,Decreased strength,Decreased  mobility,Postural dysfunction  Visit Diagnosis: Pain in left hip  Muscle weakness (generalized)  Stiffness of left hip, not elsewhere classified  Abnormal posture     Problem List Patient Active Problem List   Diagnosis Date Noted  . Lateral epicondylitis, right elbow 09/08/2020  . Primary osteoarthritis of left hip 01/03/2020  . Chronic left hip pain 12/09/2019  . Pes planus of both feet 04/15/2014   JKerin Perna PTA 09/11/20 4:02 PM  [facility] [place] [address]Phone: [phone]  Fax:  [phone] Name: Roy KoubaMRN: [id number] of Birth: [birth year]

## 2020-09-15 ENCOUNTER — Ambulatory Visit (HOSPITAL_BASED_OUTPATIENT_CLINIC_OR_DEPARTMENT_OTHER): Payer: Commercial Managed Care - PPO | Admitting: Physical Therapy

## 2020-09-15 ENCOUNTER — Other Ambulatory Visit: Payer: Self-pay

## 2020-09-15 DIAGNOSIS — R293 Abnormal posture: Secondary | ICD-10-CM

## 2020-09-15 DIAGNOSIS — M6281 Muscle weakness (generalized): Secondary | ICD-10-CM

## 2020-09-15 DIAGNOSIS — M25552 Pain in left hip: Secondary | ICD-10-CM | POA: Diagnosis not present

## 2020-09-15 DIAGNOSIS — M25652 Stiffness of left hip, not elsewhere classified: Secondary | ICD-10-CM

## 2020-09-15 NOTE — Patient Instructions (Signed)
Access Code: [email or web address] URL: [email or web address] Date: 09/15/2020 Prepared by: Estill Bamberg April Thurnell Garbe  Exercises Seated Piriformis Stretch - 1 x daily - 7 x weekly - 3 sets - 20-30 sec hold Standing Hip Flexor Stretch - 1 x daily - 7 x weekly - 20-30 sec hold - 3 reps ITB Stretch at [place] - 1 x daily - 7 x weekly - 1 sets - 3 reps - 20-30 seconds hold Seated Hamstring Stretch - 1 x daily - 7 x weekly - 3 sets - 10 reps Side Stepping with Resistance at Ankles - 1 x daily - 7 x weekly - 3 sets - 10 reps Backward Monster Walks - 1 x daily - 7 x weekly - 3 sets - 10 reps Standing Anti-Rotation Press with Anchored Resistance - 1 x daily - 7 x weekly - 3 sets - 10 reps

## 2020-09-15 NOTE — Therapy (Signed)
[place] [address] [place], [place], [address] Phone: [phone]   Fax:  [phone]  Physical Therapy Treatment  Patient Details  Name: Roy Bennett MRN: [id number] Date of Birth: Sep 16, 1965 Referring Provider (Bennett): Gregor Hams, MD   Encounter Date: 09/15/2020   Bennett End of Session - 09/15/20 0841    Visit Number 7    Number of Visits 17    Date for Bennett Re-Evaluation 09/25/20    Authorization Type UHC    Bennett Start Time 0845    Bennett Stop Time 0930    Bennett Time Calculation (min) 45 min    Activity Tolerance Patient tolerated treatment well    Behavior During Therapy [facility] for tasks assessed/performed           No past medical history on file.  No past surgical history on file.  There were no vitals filed for this visit.                      [place] [facility] Bennett Treatment/Exercise - 09/15/20 0001      Lumbar Exercises: Stretches   Passive Hamstring Stretch Left;30 seconds;Right      Knee/Hip Exercises: Stretches   Sports administrator Left;Right;2 reps;30 seconds    Quad Stretch Limitations Prone    Hip Flexor Stretch Right;Left;20 seconds    Hip Flexor Stretch Limitations Prone      Knee/Hip Exercises: Standing   Other Standing Knee Exercises lateral band walk green tband 3x10    Other Standing Knee Exercises backward walk green tband 3x10      Manual Therapy   Manual Therapy Joint mobilization    Joint Mobilization Grade III hip mobilization for flexion and extension; distraction 3x10 sec; self hip mobilizations                  Bennett Education - 09/15/20 0929    Education Details Discussed hip joint mobilization    Person(s) Educated Patient    Methods Explanation;Verbal cues;Handout    Comprehension Verbalized understanding;Returned demonstration            Bennett Short Term Goals - 09/08/20 0930      Bennett SHORT TERM GOAL #1   Title Bennett will be independent with initial HEP    Time 4     Period Weeks    Status Achieved    Target Date 08/28/20      Bennett SHORT TERM GOAL #2   Title Bennett will have improved hamstring flexibility to at least 110 deg with SLR to demo increased hip muscle lengthening    Baseline ~120 deg on eval    Time 4    Period Weeks    Status Achieved    Target Date 08/28/20      Bennett SHORT TERM GOAL #3   Title Bennett will report at least 25% decrease in pain    Baseline Constant 5/10 pain    Time 4    Period Weeks    Status Partially Met    Target Date 08/28/20             Bennett Long Term Goals - 07/31/20 1008      Bennett LONG TERM GOAL #1   Title Bennett will be independent with final HEP and transition to community wellness/gym    Time 8    Period Weeks    Status New    Target Date 09/25/20      Bennett LONG TERM GOAL #  2   Title Bennett will report decrease in pain to </=2/10    Time 8    Period Weeks    Status New    Target Date 09/25/20      Bennett LONG TERM GOAL #3   Title Bennett will be able to return to squats with at least 25# to demo improved functional strength    Baseline Unable without pain    Time 8    Period Weeks    Status New    Target Date 09/25/20                 Plan - 09/15/20 0930    Clinical Impression Statement Treatment session focused on continued joint mobilization and educating Bennett on self mobilization for home. Bennett demos increased joint stiffness anteriorly on R hip and posteriorly in L hip this session. "Clicking" occurs with R hip flexion+ER+abd combined. Provided strengthening for his land exercises.    Personal Factors and Comorbidities Age;Time since onset of injury/illness/exacerbation;Past/Current Experience    Examination-Participation Restrictions Occupation;Driving    Stability/Clinical Decision Making Stable/Uncomplicated    Rehab Potential Good    Bennett Frequency 2x / week    Bennett Duration 8 weeks    Bennett Treatment/Interventions Aquatic Therapy;ADLs/Self Care Home Management;Cryotherapy;Electrical Stimulation;Moist  Heat;Iontophoresis 77m/ml Dexamethasone;DME Instruction;Gait training;Stair training;Functional mobility training;Therapeutic activities;Therapeutic exercise;Balance training;Neuromuscular re-education;Manual techniques;Patient/family education;Passive range of motion;Dry needling;Taping;Vasopneumatic Device;Joint Manipulations;Spinal Manipulations    Bennett Next Visit Plan Continue hip and LE stretching, strengthening with core activation.    Bennett Home Exercise Plan Access Code [email or web address]   Consulted and Agree with Plan of Care Patient           Patient will benefit from skilled therapeutic intervention in order to improve the following deficits and impairments:  Decreased coordination,Decreased range of motion,Increased fascial restricitons,Pain,Hypomobility,Impaired flexibility,Improper body mechanics,Decreased strength,Decreased mobility,Postural dysfunction  Visit Diagnosis: Pain in left hip  Muscle weakness (generalized)  Stiffness of left hip, not elsewhere classified  Abnormal posture     Problem List Patient Active Problem List   Diagnosis Date Noted  . Lateral epicondylitis, right elbow 09/08/2020  . Primary osteoarthritis of left hip 01/03/2020  . Chronic left hip pain 12/09/2019  . Pes planus of both feet 04/15/2014    Roy Bennett Roy Bennett, DPT 09/15/2020, 9:32 AM  [facility] [address]Phone: [phone]  Fax:  [phone] Name: Roy ZhenMRN: [id number] of Birth: 1January 21, 1967

## 2020-09-17 ENCOUNTER — Ambulatory Visit
Admission: RE | Admit: 2020-09-17 | Discharge: 2020-09-17 | Disposition: A | Discharge status: Home / Self Care | Payer: Commercial Managed Care - PPO | Source: Ambulatory Visit | Attending: Internal Medicine | Admitting: Internal Medicine

## 2020-09-17 DIAGNOSIS — M791 Myalgia, unspecified site: Secondary | ICD-10-CM

## 2020-09-18 ENCOUNTER — Other Ambulatory Visit: Payer: Self-pay | Admitting: Internal Medicine

## 2020-09-18 ENCOUNTER — Ambulatory Visit (HOSPITAL_BASED_OUTPATIENT_CLINIC_OR_DEPARTMENT_OTHER): Payer: Commercial Managed Care - PPO | Admitting: Physical Therapy

## 2020-09-18 DIAGNOSIS — R918 Other nonspecific abnormal finding of lung field: Secondary | ICD-10-CM

## 2020-09-21 ENCOUNTER — Ambulatory Visit
Admission: RE | Admit: 2020-09-21 | Discharge: 2020-09-21 | Disposition: A | Discharge status: Home / Self Care | Payer: Commercial Managed Care - PPO | Source: Ambulatory Visit | Attending: Internal Medicine | Admitting: Internal Medicine

## 2020-09-21 DIAGNOSIS — R918 Other nonspecific abnormal finding of lung field: Secondary | ICD-10-CM

## 2020-09-21 MED ORDER — IOPAMIDOL (ISOVUE-300) INJECTION 61%
75.0000 mL | Freq: Once | INTRAVENOUS | Status: AC | PRN
  Administered 2020-09-21: 75 mL via INTRAVENOUS

## 2020-09-22 ENCOUNTER — Ambulatory Visit (HOSPITAL_BASED_OUTPATIENT_CLINIC_OR_DEPARTMENT_OTHER): Payer: Commercial Managed Care - PPO | Attending: Family Medicine | Admitting: Physical Therapy

## 2020-09-22 ENCOUNTER — Ambulatory Visit (INDEPENDENT_AMBULATORY_CARE_PROVIDER_SITE_OTHER): Payer: Commercial Managed Care - PPO | Admitting: Emergency Medicine

## 2020-09-22 ENCOUNTER — Encounter: Payer: Self-pay | Admitting: Emergency Medicine

## 2020-09-22 ENCOUNTER — Other Ambulatory Visit: Payer: Self-pay

## 2020-09-22 VITALS — BP 128/80 | HR 77 | Temp 97.9°F | Ht 77.0 in | Wt 279.6 lb

## 2020-09-22 DIAGNOSIS — M6281 Muscle weakness (generalized): Secondary | ICD-10-CM | POA: Diagnosis present

## 2020-09-22 DIAGNOSIS — R918 Other nonspecific abnormal finding of lung field: Secondary | ICD-10-CM | POA: Diagnosis not present

## 2020-09-22 DIAGNOSIS — M25652 Stiffness of left hip, not elsewhere classified: Secondary | ICD-10-CM | POA: Diagnosis present

## 2020-09-22 DIAGNOSIS — M25552 Pain in left hip: Secondary | ICD-10-CM | POA: Insufficient documentation

## 2020-09-22 DIAGNOSIS — R911 Solitary pulmonary nodule: Secondary | ICD-10-CM | POA: Diagnosis not present

## 2020-09-22 DIAGNOSIS — R293 Abnormal posture: Secondary | ICD-10-CM | POA: Diagnosis present

## 2020-09-22 NOTE — Therapy (Signed)
[place] [address] [place], [place], [address] Phone: [phone]   Fax:  [phone]  Physical Therapy Treatment and Re-Certification  Patient Details  Name: Roy Bennett MRN: [id number] Date of Birth: Jul 28, 1965 Referring Provider (PT): Gregor Hams, MD   Encounter Date: 09/22/2020   PT End of Session - 09/22/20 0930    Visit Number 8    Number of Visits 17    Date for PT Re-Evaluation 11/03/20    Authorization Type UHC    PT Start Time 0845    PT Stop Time 0930    PT Time Calculation (min) 45 min    Activity Tolerance Patient tolerated treatment well    Behavior During Therapy [name] for tasks assessed/performed           No past medical history on file.  No past surgical history on file.  There were no vitals filed for this visit.    Subjective Assessment - 09/22/20 0845    Subjective Pt reports he had a bad day on Sunday. Pt reports he felt "locked up". He feels he can't move when this occurs. Stretching helped but did not diminish it completely. Pt reports the hip mobilizations have helped.    Pertinent History n/a    Limitations Sitting    How long can you sit comfortably? ~1 minute    How long can you stand comfortably? ~10 minutes    How long can you walk comfortably? no issues -- pt reports he likes walking with left LE on low side of slant    Patient Stated Goals Would like to get regular exericise program for hip strengthening/conditioning; improve flexibility    Currently in Pain? Yes    Pain Score 5     Pain Location Hip    Pain Orientation Left;Anterior                          Objective measurements completed on examination: See above findings.       [place] Adult PT Treatment/Exercise - 09/22/20 0001      Lumbar Exercises: Stretches   Passive Hamstring Stretch Right;Left;30 seconds    Passive Hamstring Stretch Limitations Foot forward & foot to the side    Other  Lumbar Stretch Exercise 90/90 SI correction with dowel 5x10 sec      Lumbar Exercises: Aerobic   Recumbent Bike L1 x 5 min      Lumbar Exercises: Standing   Wall Slides 20 reps    Wall Slides Limitations With PPT      Lumbar Exercises: Supine   Pelvic Tilt 5 reps    Bent Knee Raise 20 reps    Bridge 10 reps   single leg     Manual Therapy   Manual Therapy Joint mobilization    Joint Mobilization Grade III hip mobilization inferior, lateral, medial and anterior glides on L, inferior and lateral glides on R; distraction 3x10 sec                    PT Short Term Goals - 09/08/20 0930      PT SHORT TERM GOAL #1   Title Pt will be independent with initial HEP    Time 4    Period Weeks    Status Achieved    Target Date 08/28/20      PT SHORT TERM GOAL #2   Title Pt will have improved hamstring flexibility to at least  110 deg with SLR to demo increased hip muscle lengthening    Baseline ~120 deg on eval    Time 4    Period Weeks    Status Achieved    Target Date 08/28/20      PT SHORT TERM GOAL #3   Title Pt will report at least 25% decrease in pain    Baseline Constant 5/10 pain    Time 4    Period Weeks    Status Partially Met    Target Date 08/28/20             PT Long Term Goals - 09/22/20 0929      PT LONG TERM GOAL #1   Title Pt will be independent with final HEP and transition to community wellness/gym    Time 8    Period Weeks    Status Achieved      PT LONG TERM GOAL #2   Title Pt will report decrease in pain to </=2/10    Baseline Greatest pain can still be >5/10 (i.e. past Sunday) but after pool and therapy can be pain free (09/22/20)    Time 6    Period Weeks    Status On-going    Target Date 11/03/20      PT LONG TERM GOAL #3   Title Pt will be able to return to squats with at least 25# to demo improved functional strength    Baseline Able to perform mini wall squat but is limited due to hip ROM (09/22/20)    Time 6    Period Weeks     Status On-going    Target Date 11/03/20                  Plan - 09/22/20 0936    Clinical Impression Statement Pt continues to have instances of "locking up." Continued joint mobilization for improved hip mobility. Continued to progress core and hip stabiliztion. Continues to complain of adductor tightness -- provided adductor/hamstring stretch in sitting. Pt reports improved pain after session. Pt has partially met LTGs; however, would benefit from continued PT to improve his hip/pelvic stability and strength.    Personal Factors and Comorbidities Age;Time since onset of injury/illness/exacerbation;Past/Current Experience    Examination-Participation Restrictions Occupation;Driving    Stability/Clinical Decision Making Stable/Uncomplicated    Rehab Potential Good    PT Frequency 2x / week    PT Duration 6 weeks    PT Treatment/Interventions Aquatic Therapy;ADLs/Self Care Home Management;Cryotherapy;Electrical Stimulation;Moist Heat;Iontophoresis 36m/ml Dexamethasone;DME Instruction;Gait training;Stair training;Functional mobility training;Therapeutic activities;Therapeutic exercise;Balance training;Neuromuscular re-education;Manual techniques;Patient/family education;Passive range of motion;Dry needling;Taping;Vasopneumatic Device;Joint Manipulations;Spinal Manipulations    PT Next Visit Plan Continue hip and LE stretching, strengthening with core activation.    PT Home Exercise Plan Access Code [email or web address]   Consulted and Agree with Plan of Care Patient           Patient will benefit from skilled therapeutic intervention in order to improve the following deficits and impairments:  Decreased coordination,Decreased range of motion,Increased fascial restricitons,Pain,Hypomobility,Impaired flexibility,Improper body mechanics,Decreased strength,Decreased mobility,Postural dysfunction  Visit Diagnosis: Pain in left hip  Muscle weakness (generalized)  Stiffness of left hip, not  elsewhere classified  Abnormal posture     Problem List Patient Active Problem List   Diagnosis Date Noted  . Lateral epicondylitis, right elbow 09/08/2020  . Primary osteoarthritis of left hip 01/03/2020  . Chronic left hip pain 12/09/2019  . Pes planus of both feet 04/15/2014    Latresa Gasser [name] L  Kamira Mellette PT, DPT 09/22/2020, 9:39 AM  [facility] [address] [place], [place], [address] Phone: [phone]   Fax:  [phone]  Name: Roy Bennett MRN: [id number] Date of Birth: Jun 12, 1966

## 2020-09-22 NOTE — H&P (View-Only) (Signed)
Subjective:    Patient ID: Roy Bennett, male    DOB: February 13, 1966, 55 y.o.   MRN: [id number]  HPI 55 year old never smoker with a history of hyperlipidemia, osteoarthritis, lateral epicondylitis of his right elbow, thyroid nodule, heterozygous for hemachromatosis.    He underwent CT cardiac calcium evaluation on 09/17/2020 that identified pulmonary nodularity that was not seen on a previous CT chest from 2016.  This prompted a dedicated CT chest performed 09/22/2020 which I have reviewed.  This showed some mild nonspecific mediastinal adenopathy, largest was at station 4R 1.3 cm.  There was a well-circumscribed 11 mm nodule, 5 mm nodules in the right lower lobe, bandlike cluster of subpleural nodularity posterior aspect of the superior right lower lobe all new since 2016, largest 7 mm. He had COVID x 2 in the last 2 years. He had a reassuring CSY, benign polyps.   No hx renal failure Reassuring PSA 10/2019   Review of Systems As per HPI  PMH: Elevated lipids OA Had PNA in 2003 No personal hx CA  Family History  Problem Relation Age of Onset  . Heart disease Mother   . Clotting disorder Mother   . Cancer Mother     Significant for CA in his niece Sister had breast CA   Social History   Socioeconomic History  . Marital status: Married    Spouse name: Not on file  . Number of children: Not on file  . Years of education: Not on file  . Highest education level: Not on file  Occupational History  . Not on file  Tobacco Use  . Smoking status: Never Smoker  . Smokeless tobacco: Never Used  Substance and Sexual Activity  . Alcohol use: Yes    Alcohol/week: 2.0 standard drinks    Types: 2 Standard drinks or equivalent per week  . Drug use: Not on file  . Sexual activity: Not on file  Other Topics Concern  . Not on file  Social History Narrative  . Not on file   Social Determinants of Health   Financial Resource Strain: Not on file  Food Insecurity: Not on file   Transportation Needs: Not on file  Physical Activity: Not on file  Stress: Not on file  Social Connections: Not on file  Intimate Partner Violence: Not on file    From [place], lived in [place]; lived in [place], [place], [place]  No Known Allergies   Outpatient Medications Prior to Visit  Medication Sig Dispense Refill  . gabapentin (NEURONTIN) 300 MG capsule Take 1 capsule (300 mg total) by mouth 3 (three) times daily as needed. (Patient not taking: Reported on 09/22/2020) 90 capsule 3   No facility-administered medications prior to visit.         Objective:   Physical Exam Vitals:   09/22/20 1529  BP: 128/80  Pulse: 77  Temp: 97.9 F (36.6 C)  TempSrc: Temporal  SpO2: 96%  Weight: 279 lb 9.6 oz (126.8 kg)  Height: 6\' 5"  (1.956 m)   Gen: Pleasant, well-nourished, in no distress,  normal affect  ENT: No lesions,  mouth clear,  oropharynx clear, no postnasal drip  Neck: No JVD, no stridor  Lungs: No use of accessory muscles, no crackles or wheezing on normal respiration, no wheeze on forced expiration  Cardiovascular: RRR, heart sounds normal, no murmur or gallops, no peripheral edema  Musculoskeletal: No deformities, no cyanosis or clubbing  Neuro: alert, awake, non focal  Skin: Warm, no lesions or rash  Assessment & Plan:  Pulmonary nodules/lesions, multiple Rounded pulmonary nodules of various sizes new since 2016 and a low risk patient.  No history of colon cancer, testicular, prostatic cancer.  No family history of lung cancer.  Discussed within the differential diagnosis which includes benign fungal disease such as histoplasma (he is from the [facility]), noninfectious granulomatous disease such as the granulomatous angiopathy's.  Consider infectious.  Less likely malignancy. Plan to perform screening ANA, ANCA, RF.  He has a normal PSA through his primary care physician, no evidence or history for testicular CA. I recommended that we follow his CT  in 3 months.  He wants to be more aggressive and evaluate in more detail upfront.  We will obtain a PET scan.  I will also schedule him for navigational bronchoscopy with EBUS to evaluate the mediastinal nodes.  I will try to get this done on 4/25.   Baltazar Apo, MD, PhD 09/22/2020, 5:22 PM [facility] [phone] or if no answer before 7:00PM call [phone] For any issues after 7:00PM please call eLink [phone]

## 2020-09-22 NOTE — Assessment & Plan Note (Signed)
Rounded pulmonary nodules of various sizes new since 2016 and a low risk patient.  No history of colon cancer, testicular, prostatic cancer.  No family history of lung cancer.  Discussed within the differential diagnosis which includes benign fungal disease such as histoplasma (he is from the [facility]), noninfectious granulomatous disease such as the granulomatous angiopathy's.  Consider infectious.  Less likely malignancy. Plan to perform screening ANA, ANCA, RF.  He has a normal PSA through his primary care physician, no evidence or history for testicular CA. I recommended that we follow his CT in 3 months.  He wants to be more aggressive and evaluate in more detail upfront.  We will obtain a PET scan.  I will also schedule him for navigational bronchoscopy with EBUS to evaluate the mediastinal nodes.  I will try to get this done on 4/25.

## 2020-09-22 NOTE — Patient Instructions (Signed)
We will perform a PET scan to better evaluate your pulmonary nodules. Lab work today We will arrange for navigational bronchoscopy to biopsy her pulmonary nodules.  This we done under general anesthesia as an outpatient.  You will need a designated driver.  We will try to get this scheduled for 4/25.  We will contact you with the precise details. Follow with Dr Lamonte Sakai in 1 month

## 2020-09-22 NOTE — Progress Notes (Signed)
Subjective:    Patient ID: Roy Bennett, male    DOB: 04/10/1966, 55 y.o.   MRN: [id number]  HPI 55 year old never smoker with a history of hyperlipidemia, osteoarthritis, lateral epicondylitis of his right elbow, thyroid nodule, heterozygous for hemachromatosis.    He underwent CT cardiac calcium evaluation on 09/17/2020 that identified pulmonary nodularity that was not seen on a previous CT chest from 2016.  This prompted a dedicated CT chest performed 09/22/2020 which I have reviewed.  This showed some mild nonspecific mediastinal adenopathy, largest was at station 4R 1.3 cm.  There was a well-circumscribed 11 mm nodule, 5 mm nodules in the right lower lobe, bandlike cluster of subpleural nodularity posterior aspect of the superior right lower lobe all new since 2016, largest 7 mm. He had COVID x 2 in the last 2 years. He had a reassuring CSY, benign polyps.   No hx renal failure Reassuring PSA 10/2019   Review of Systems As per HPI  PMH: Elevated lipids OA Had PNA in 2003 No personal hx CA  Family History  Problem Relation Age of Onset  . Heart disease Mother   . Clotting disorder Mother   . Cancer Mother     Significant for CA in his niece Sister had breast CA   Social History   Socioeconomic History  . Marital status: Married    Spouse name: Not on file  . Number of children: Not on file  . Years of education: Not on file  . Highest education level: Not on file  Occupational History  . Not on file  Tobacco Use  . Smoking status: Never Smoker  . Smokeless tobacco: Never Used  Substance and Sexual Activity  . Alcohol use: Yes    Alcohol/week: 2.0 standard drinks    Types: 2 Standard drinks or equivalent per week  . Drug use: Not on file  . Sexual activity: Not on file  Other Topics Concern  . Not on file  Social History Narrative  . Not on file   Social Determinants of Health   Financial Resource Strain: Not on file  Food Insecurity: Not on file   Transportation Needs: Not on file  Physical Activity: Not on file  Stress: Not on file  Social Connections: Not on file  Intimate Partner Violence: Not on file    From [place], lived in [place]; lived in [place], [place], [place]  No Known Allergies   Outpatient Medications Prior to Visit  Medication Sig Dispense Refill  . gabapentin (NEURONTIN) 300 MG capsule Take 1 capsule (300 mg total) by mouth 3 (three) times daily as needed. (Patient not taking: Reported on 09/22/2020) 90 capsule 3   No facility-administered medications prior to visit.         Objective:   Physical Exam Vitals:   09/22/20 1529  BP: 128/80  Pulse: 77  Temp: 97.9 F (36.6 C)  TempSrc: Temporal  SpO2: 96%  Weight: 279 lb 9.6 oz (126.8 kg)  Height: 6\' 5"  (1.956 m)   Gen: Pleasant, well-nourished, in no distress,  normal affect  ENT: No lesions,  mouth clear,  oropharynx clear, no postnasal drip  Neck: No JVD, no stridor  Lungs: No use of accessory muscles, no crackles or wheezing on normal respiration, no wheeze on forced expiration  Cardiovascular: RRR, heart sounds normal, no murmur or gallops, no peripheral edema  Musculoskeletal: No deformities, no cyanosis or clubbing  Neuro: alert, awake, non focal  Skin: Warm, no lesions or rash  Assessment & Plan:  Pulmonary nodules/lesions, multiple Rounded pulmonary nodules of various sizes new since 2016 and a low risk patient.  No history of colon cancer, testicular, prostatic cancer.  No family history of lung cancer.  Discussed within the differential diagnosis which includes benign fungal disease such as histoplasma (he is from the [facility]), noninfectious granulomatous disease such as the granulomatous angiopathy's.  Consider infectious.  Less likely malignancy. Plan to perform screening ANA, ANCA, RF.  He has a normal PSA through his primary care physician, no evidence or history for testicular CA. I recommended that we follow his CT  in 3 months.  He wants to be more aggressive and evaluate in more detail upfront.  We will obtain a PET scan.  I will also schedule him for navigational bronchoscopy with EBUS to evaluate the mediastinal nodes.  I will try to get this done on 4/25.   Baltazar Apo, MD, PhD 09/22/2020, 5:22 PM [facility] [phone] or if no answer before 7:00PM call [phone] For any issues after 7:00PM please call eLink [phone]

## 2020-09-23 ENCOUNTER — Telehealth: Payer: Self-pay | Admitting: Emergency Medicine

## 2020-09-23 LAB — ANCA SCREEN W REFLEX TITER: ANCA Screen: NEGATIVE

## 2020-09-23 LAB — RHEUMATOID FACTOR: Rheumatoid fact SerPl-aCnc: <14 IU/mL (ref <14)

## 2020-09-23 LAB — ANA: Anti Nuclear Antibody (ANA): NEGATIVE

## 2020-09-23 NOTE — Telephone Encounter (Signed)
I don't know if they will do it the day of the bronch. I guess we should request it and see.

## 2020-09-23 NOTE — Telephone Encounter (Signed)
Prior to procedure due to being out of town till sat before will he be able to do covid the morning of the procedure?

## 2020-09-23 NOTE — Telephone Encounter (Signed)
I will send a message to [facility] tomorrow and ask

## 2020-09-24 NOTE — Telephone Encounter (Signed)
I don't know that I can get the PET done locally any quicker, but I would be ok if the [facility] were able to see if we can get it outside the [facility] system any faster.  I do agree that it would be great to have the PET images before the planned procedure.

## 2020-09-24 NOTE — Telephone Encounter (Signed)
Please advise on message from Dr. Yevonne Pax, Rose Fillers, MD 44 minutes ago (11:37 AM)       I don't know that I can get the PET done locally any quicker, but I would be ok if the [facility] were able to see if we can get it outside the [facility] system any faster.  I do agree that it would be great to have the PET images before the planned procedure.

## 2020-09-24 NOTE — Telephone Encounter (Signed)
Please advise on patient mychart message    Hello Dr Lamonte Sakai, I hope that you are well. I wanted to reach out to gain your advice and possible assistance with scheduling my PET scan. I've also reached out to Dr Shelia Media with the same request just to try to cover all of my bases.   Right now, [facility] has me scheduled for 10/13/20 which is the day after you take your tissue samples (I'm scheduled with you the morning of 4/25). Ideally, I would like to have the PET scan done this week or next week.before I meet with you for the tissue sampling. Should I (or can I) schedule a PET scan in [place] or [place] to try to speed this up?  We are going out of town for [place] so I cannot do the 18th through the 22nd of April. Any assistance you can provide is very gratefully appreciated!  Thank you Dr Roy Bennett! Roy Bennett [phone]

## 2020-09-25 ENCOUNTER — Ambulatory Visit (HOSPITAL_BASED_OUTPATIENT_CLINIC_OR_DEPARTMENT_OTHER): Payer: Commercial Managed Care - PPO | Admitting: Physical Therapy

## 2020-09-25 ENCOUNTER — Encounter (HOSPITAL_BASED_OUTPATIENT_CLINIC_OR_DEPARTMENT_OTHER): Payer: Self-pay | Admitting: Physical Therapy

## 2020-09-25 ENCOUNTER — Other Ambulatory Visit: Payer: Self-pay

## 2020-09-25 DIAGNOSIS — M25552 Pain in left hip: Secondary | ICD-10-CM

## 2020-09-25 DIAGNOSIS — M25652 Stiffness of left hip, not elsewhere classified: Secondary | ICD-10-CM

## 2020-09-25 DIAGNOSIS — M6281 Muscle weakness (generalized): Secondary | ICD-10-CM

## 2020-09-25 DIAGNOSIS — R293 Abnormal posture: Secondary | ICD-10-CM

## 2020-09-25 NOTE — Therapy (Signed)
[facility] [address] [place], [place], [address] Phone: [phone]   Fax:  [phone]  Physical Therapy Treatment  Patient Details  Name: Roy Bennett MRN: [id number] Date of Birth: Jun 26, 1965 Referring Provider (PT): Gregor Hams, MD   Encounter Date: 09/25/2020   PT End of Session - 09/25/20 1611    Visit Number 9    Number of Visits 17    Date for PT Re-Evaluation 11/03/20    Authorization Type UHC    PT Start Time 1401    PT Stop Time 1440    PT Time Calculation (min) 39 min    Activity Tolerance Patient tolerated treatment well    Behavior During Therapy [facility] for tasks assessed/performed           Past Medical History:  Diagnosis Date  . Hyperlipidemia     History reviewed. No pertinent surgical history.  There were no vitals filed for this visit.   Subjective Assessment - 09/25/20 1610    Subjective Pt reported he hasn't gotten into pool but has been doing his land exercises daily. Relief from last pool session lasted more than 6hrs.    Patient Stated Goals Would like to get regular exericise program for hip strengthening/conditioning; improve flexibility    Currently in Pain? Yes    Pain Score 4     Pain Location Hip    Pain Orientation Left;Posterior;Lateral    Pain Descriptors / Indicators Aching;Tightness    Aggravating Factors  sitting, driving    Pain Relieving Factors walking, stretching.           Pt seen for aquatic therapy today.  Treatment took place in water 3.25-4.8 ft in depth at the [employer] pool. Temp of water was 91.  Pt entered/exited the pool via stairs independently with single rail.  Treatment:   Forward/ backward gait. Side stepping Quad stretch with noodle supporting ankle, then forward trunk lean with hip ext x 10 each side Side to side lunge for adductor stretch. Hip ER to touch inside with foot x 10 each. Curtsy holding noodle x 5 each side, then  adding hip/knee flexion to noodle Walking hurdles forward/ backward Lt hip flexor stretch with arm over head x 15 sec x reps (lunge position) Light jogging with arm swings forward/backward Modified pigeon pose x 2 reps each leg on pool bench. Repeated forward/backward light jogging.    Pt requires buoyancy for support and to offload joints with strengthening exercises. Viscosity of the water is needed for resistance of strengthening; water current perturbations provides challenge to standing balance unsupported, requiring increased core activation.     PT Short Term Goals - 09/08/20 0930      PT SHORT TERM GOAL #1   Title Pt will be independent with initial HEP    Time 4    Period Weeks    Status Achieved    Target Date 08/28/20      PT SHORT TERM GOAL #2   Title Pt will have improved hamstring flexibility to at least 110 deg with SLR to demo increased hip muscle lengthening    Baseline ~120 deg on eval    Time 4    Period Weeks    Status Achieved    Target Date 08/28/20      PT SHORT TERM GOAL #3   Title Pt will report at least 25% decrease in pain    Baseline Constant 5/10 pain    Time 4  Period Weeks    Status Partially Met    Target Date 08/28/20             PT Long Term Goals - 09/22/20 0929      PT LONG TERM GOAL #1   Title Pt will be independent with final HEP and transition to community wellness/gym    Time 8    Period Weeks    Status Achieved      PT LONG TERM GOAL #2   Title Pt will report decrease in pain to </=2/10    Baseline Greatest pain can still be >5/10 (i.e. past Sunday) but after pool and therapy can be pain free (09/22/20)    Time 6    Period Weeks    Status On-going    Target Date 11/03/20      PT LONG TERM GOAL #3   Title Pt will be able to return to squats with at least 25# to demo improved functional strength    Baseline Able to perform mini wall squat but is limited due to hip ROM (09/22/20)    Time 6    Period Weeks    Status  On-going    Target Date 11/03/20                 Plan - 09/25/20 1611    Clinical Impression Statement Pt reported reduction in pain to 2/10 at end of session . Lt hip rotators less tight, but pt reports continued tightness at end range motions. Encouraged  pt to avoid stomach sleeping with Lt leg cocked up to side.  Pt making good progress towards remaining goals.    Personal Factors and Comorbidities Age;Time since onset of injury/illness/exacerbation;Past/Current Experience    Examination-Participation Restrictions Occupation;Driving    Stability/Clinical Decision Making Stable/Uncomplicated    Rehab Potential Good    PT Frequency 2x / week    PT Duration 6 weeks    PT Treatment/Interventions Aquatic Therapy;ADLs/Self Care Home Management;Cryotherapy;Electrical Stimulation;Moist Heat;Iontophoresis 31m/ml Dexamethasone;DME Instruction;Gait training;Stair training;Functional mobility training;Therapeutic activities;Therapeutic exercise;Balance training;Neuromuscular re-education;Manual techniques;Patient/family education;Passive range of motion;Dry needling;Taping;Vasopneumatic Device;Joint Manipulations;Spinal Manipulations    PT Next Visit Plan Continue hip and LE stretching, strengthening with core activation.    PT Home Exercise Plan Access Code [email or web address]   Consulted and Agree with Plan of Care Patient           Patient will benefit from skilled therapeutic intervention in order to improve the following deficits and impairments:  Decreased coordination,Decreased range of motion,Increased fascial restricitons,Pain,Hypomobility,Impaired flexibility,Improper body mechanics,Decreased strength,Decreased mobility,Postural dysfunction  Visit Diagnosis: Pain in left hip  Muscle weakness (generalized)  Stiffness of left hip, not elsewhere classified  Abnormal posture     Problem List Patient Active Problem List   Diagnosis Date Noted  . Pulmonary nodules/lesions, multiple  09/22/2020  . Lateral epicondylitis, right elbow 09/08/2020  . Primary osteoarthritis of left hip 01/03/2020  . Chronic left hip pain 12/09/2019  . Pes planus of both feet 04/15/2014   JKerin Perna PTA 09/25/20 4:15 PM  [facility] [address] [address]Phone: [phone]  Fax:  [phone] Name: SReyaan ThomaMRN: [id number] of Birth: [birth year]

## 2020-09-25 NOTE — Telephone Encounter (Signed)
Spoke to [place] she is aware that patient will have to do covid test same day

## 2020-09-29 ENCOUNTER — Ambulatory Visit (HOSPITAL_BASED_OUTPATIENT_CLINIC_OR_DEPARTMENT_OTHER): Payer: Commercial Managed Care - PPO | Admitting: Physical Therapy

## 2020-09-29 ENCOUNTER — Other Ambulatory Visit: Payer: Self-pay

## 2020-09-29 DIAGNOSIS — R293 Abnormal posture: Secondary | ICD-10-CM

## 2020-09-29 DIAGNOSIS — M25652 Stiffness of left hip, not elsewhere classified: Secondary | ICD-10-CM

## 2020-09-29 DIAGNOSIS — M25552 Pain in left hip: Secondary | ICD-10-CM

## 2020-09-29 DIAGNOSIS — M6281 Muscle weakness (generalized): Secondary | ICD-10-CM

## 2020-09-29 NOTE — Therapy (Addendum)
[facility] [address] [place], [place], [address] Phone: [phone]   Fax:  [phone]  Physical Therapy Treatment/Discharge  Patient Details  Name: Roy Bennett MRN: [id number] Date of Birth: 04-18-1966 Referring Provider (PT): Gregor Hams, MD   Encounter Date: 09/29/2020   PT End of Session - 09/29/20 0919    Visit Number 10    Number of Visits 17    Date for PT Re-Evaluation 11/03/20    Authorization Type UHC    PT Start Time 0845    PT Stop Time 0930    PT Time Calculation (min) 45 min    Activity Tolerance Patient tolerated treatment well    Behavior During Therapy [facility] for tasks assessed/performed           Past Medical History:  Diagnosis Date  . Hyperlipidemia     No past surgical history on file.  There were no vitals filed for this visit.   Subjective Assessment - 09/29/20 0848    Subjective Pt states he got a massage for 90 minutes and felt like he had no problem. Pt reports it feels a little stiff today.    Patient is accompained by: Family member    Pertinent History n/a    Limitations Sitting    How long can you sit comfortably? ~1 minute    How long can you stand comfortably? ~10 minutes    How long can you walk comfortably? no issues -- pt reports he likes walking with left LE on low side of slant    Patient Stated Goals Would like to get regular exericise program for hip strengthening/conditioning; improve flexibility    Currently in Pain? Yes    Pain Score 4     Pain Location Hip                             [facility] Adult PT Treatment/Exercise - 09/29/20 0001      Lumbar Exercises: Aerobic   Recumbent Bike L1 x 4 min      Lumbar Exercises: Machines for Strengthening   Other Lumbar Machine Exercise Shuttle 75# 2x10 DL press; 2x10 SL press;      Lumbar Exercises: Standing   Other Standing Lumbar Exercises Palloff press 2x10      Lumbar Exercises: Supine   Other  Supine Lumbar Exercises Clamshell 5x10 sec hold iso    Other Supine Lumbar Exercises Adduction 5x10 sec hold iso      Lumbar Exercises: Quadruped   Other Quadruped Lumbar Exercises quadruped leg extension 2x10      Knee/Hip Exercises: Standing   Wall Squat 10 reps      Manual Therapy   Manual Therapy Joint mobilization    Joint Mobilization Grade III hip mobilization inferior, lateral, medial and anterior glides on L, inferior and lateral glides on R; distraction 3x10 sec    Muscle Energy Technique SI joint correction in 90/90                    PT Short Term Goals - 09/08/20 0930      PT SHORT TERM GOAL #1   Title Pt will be independent with initial HEP    Time 4    Period Weeks    Status Achieved    Target Date 08/28/20      PT SHORT TERM GOAL #2   Title Pt will have improved hamstring flexibility to at least  110 deg with SLR to demo increased hip muscle lengthening    Baseline ~120 deg on eval    Time 4    Period Weeks    Status Achieved    Target Date 08/28/20      PT SHORT TERM GOAL #3   Title Pt will report at least 25% decrease in pain    Baseline Constant 5/10 pain    Time 4    Period Weeks    Status Partially Met    Target Date 08/28/20             PT Long Term Goals - 09/22/20 0929      PT LONG TERM GOAL #1   Title Pt will be independent with final HEP and transition to community wellness/gym    Time 8    Period Weeks    Status Achieved      PT LONG TERM GOAL #2   Title Pt will report decrease in pain to </=2/10    Baseline Greatest pain can still be >5/10 (i.e. past Sunday) but after pool and therapy can be pain free (09/22/20)    Time 6    Period Weeks    Status On-going    Target Date 11/03/20      PT LONG TERM GOAL #3   Title Pt will be able to return to squats with at least 25# to demo improved functional strength    Baseline Able to perform mini wall squat but is limited due to hip ROM (09/22/20)    Time 6    Period Weeks     Status On-going    Target Date 11/03/20                 Plan - 09/29/20 0917    Clinical Impression Statement Treatment focused on improving strength and end range hip mobility. Continued to work on pelvic alignment and stability with core strengthening.    Personal Factors and Comorbidities Age;Time since onset of injury/illness/exacerbation;Past/Current Experience    Examination-Participation Restrictions Occupation;Driving    Stability/Clinical Decision Making Stable/Uncomplicated    Rehab Potential Good    PT Frequency 2x / week    PT Duration 6 weeks    PT Treatment/Interventions Aquatic Therapy;ADLs/Self Care Home Management;Cryotherapy;Electrical Stimulation;Moist Heat;Iontophoresis 91m/ml Dexamethasone;DME Instruction;Gait training;Stair training;Functional mobility training;Therapeutic activities;Therapeutic exercise;Balance training;Neuromuscular re-education;Manual techniques;Patient/family education;Passive range of motion;Dry needling;Taping;Vasopneumatic Device;Joint Manipulations;Spinal Manipulations    PT Next Visit Plan Continue hip and LE stretching, strengthening with core activation.    PT Home Exercise Plan Access Code [email or web address]   Consulted and Agree with Plan of Care Patient           Patient will benefit from skilled therapeutic intervention in order to improve the following deficits and impairments:  Decreased coordination,Decreased range of motion,Increased fascial restricitons,Pain,Hypomobility,Impaired flexibility,Improper body mechanics,Decreased strength,Decreased mobility,Postural dysfunction  Visit Diagnosis: Pain in left hip  Muscle weakness (generalized)  Stiffness of left hip, not elsewhere classified  Abnormal posture     Problem List Patient Active Problem List   Diagnosis Date Noted  . Pulmonary nodules/lesions, multiple 09/22/2020  . Lateral epicondylitis, right elbow 09/08/2020  . Primary osteoarthritis of left hip 01/03/2020  .  Chronic left hip pain 12/09/2019  . Pes planus of both feet 04/15/2014    [name] [name] L NUhrichsvillePT, DPT 09/29/2020, 9:20 AM  [facility] [address]Phone: [phone]  Fax:  [phone] Name: Roy McguirtMRN:  [id number] Date of Birth: 1965/12/01  PHYSICAL THERAPY DISCHARGE SUMMARY  Visits from Start of Care: 10  Current functional level related to goals / functional outcomes: See above   Remaining deficits: See above   Education / Equipment: Anatomy of condition, POC, HEP, exercise form/rationale  Plan: Patient agrees to discharge.  Patient goals were partially met. Patient is being discharged due to not returning since the last visit.  ?????  Patient cancelled appointments, reason not documented.    Jessica C. Hightower PT, DPT 11/24/20 8:04 AM

## 2020-09-30 ENCOUNTER — Ambulatory Visit
Admission: RE | Admit: 2020-09-30 | Discharge: 2020-09-30 | Disposition: A | Discharge status: Home / Self Care | Payer: Commercial Managed Care - PPO | Source: Ambulatory Visit | Attending: Emergency Medicine | Admitting: Emergency Medicine

## 2020-09-30 ENCOUNTER — Other Ambulatory Visit: Payer: Self-pay

## 2020-09-30 DIAGNOSIS — R911 Solitary pulmonary nodule: Secondary | ICD-10-CM

## 2020-09-30 DIAGNOSIS — K4020 Bilateral inguinal hernia, without obstruction or gangrene, not specified as recurrent: Secondary | ICD-10-CM | POA: Insufficient documentation

## 2020-09-30 LAB — GLUCOSE, CAPILLARY: Glucose-Capillary: 96 mg/dL (ref 70–99)

## 2020-09-30 MED ORDER — FLUDEOXYGLUCOSE F - 18 (FDG) INJECTION
14.5000 | Freq: Once | INTRAVENOUS | Status: AC | PRN
  Administered 2020-09-30: 15.6 via INTRAVENOUS

## 2020-10-05 ENCOUNTER — Other Ambulatory Visit (HOSPITAL_COMMUNITY): Payer: Commercial Managed Care - PPO

## 2020-10-06 ENCOUNTER — Ambulatory Visit (HOSPITAL_BASED_OUTPATIENT_CLINIC_OR_DEPARTMENT_OTHER): Payer: Commercial Managed Care - PPO | Admitting: Physical Therapy

## 2020-10-09 ENCOUNTER — Other Ambulatory Visit: Payer: Self-pay

## 2020-10-09 ENCOUNTER — Encounter (HOSPITAL_COMMUNITY): Payer: Self-pay | Admitting: Emergency Medicine

## 2020-10-09 ENCOUNTER — Ambulatory Visit (HOSPITAL_BASED_OUTPATIENT_CLINIC_OR_DEPARTMENT_OTHER): Payer: Commercial Managed Care - PPO | Admitting: Physical Therapy

## 2020-10-09 NOTE — Progress Notes (Signed)
PCP - Dr. Shelia Media Cardiologist -    COVID TEST- DOS  Anesthesia review: n/a  -------------  SDW INSTRUCTIONS:  Your procedure is scheduled on 10/12/20 Monday. Please report to [facility] Main Entrance "A" at 0530 A.M., and check in at the Admitting office. Call this number if you have problems the morning of surgery: [phone]   Remember: Do not eat or drink after midnight the night before your surgery    Medications to take morning of surgery with a sip of water include: none  As of today, STOP taking any Aspirin (unless otherwise instructed by your surgeon), Aleve, Naproxen, Ibuprofen, Motrin, Advil, Goody's, BC's, all herbal medications, fish oil, and all vitamins.    The Morning of Surgery Do not wear jewelry Do not wear lotions, powders, colognes, or deodorant Men may shave face and neck. Do not bring valuables to the hospital. [facility] is not responsible for any belongings or valuables. If you are a smoker, DO NOT Smoke 24 hours prior to surgery If you wear a CPAP at night please bring your mask the morning of surgery  Remember that you must have someone to transport you home after your surgery, and remain with you for 24 hours if you are discharged the same day. Please bring cases for contacts, glasses, hearing aids, dentures or bridgework because it cannot be worn into surgery.   Patients discharged the day of surgery will not be allowed to drive home.   Please shower the NIGHT BEFORE SURGERY and the MORNING OF SURGERY with DIAL Soap. Wear comfortable clothes the morning of surgery. Oral Hygiene is also important to reduce your risk of infection.  Remember - BRUSH YOUR TEETH THE MORNING OF SURGERY WITH YOUR REGULAR TOOTHPASTE  Patient denies shortness of breath, fever, cough and chest pain.

## 2020-10-11 NOTE — Anesthesia Preprocedure Evaluation (Addendum)
Anesthesia Evaluation  Patient identified by MRN, date of birth, ID band Patient awake    Reviewed: Allergy & Precautions, H&P , NPO status , Patient's Chart, lab work & pertinent test results  Airway Mallampati: I  TM Distance: >3 FB Neck ROM: Full    Dental no notable dental hx. (+) Teeth Intact, Dental Advisory Given   Pulmonary neg pulmonary ROS,    Pulmonary exam normal breath sounds clear to auscultation       Cardiovascular negative cardio ROS   Rhythm:Regular Rate:Normal     Neuro/Psych negative neurological ROS  negative psych ROS   GI/Hepatic negative GI ROS, Neg liver ROS,   Endo/Other  negative endocrine ROS  Renal/GU negative Renal ROS  negative genitourinary   Musculoskeletal   Abdominal   Peds  Hematology negative hematology ROS (+)   Anesthesia Other Findings   Reproductive/Obstetrics negative OB ROS                            Anesthesia Physical Anesthesia Plan  ASA: II  Anesthesia Plan: General   Post-op Pain Management:    Induction: Intravenous  PONV Risk Score and Plan: 3 and Ondansetron, Dexamethasone and Midazolam  Airway Management Planned: Oral ETT  Additional Equipment:   Intra-op Plan:   Post-operative Plan: Extubation in OR  Informed Consent: I have reviewed the patients History and Physical, chart, labs and discussed the procedure including the risks, benefits and alternatives for the proposed anesthesia with the patient or authorized representative who has indicated his/her understanding and acceptance.     Dental advisory given  Plan Discussed with: CRNA and Surgeon  Anesthesia Plan Comments:        Anesthesia Quick Evaluation

## 2020-10-12 ENCOUNTER — Ambulatory Visit (HOSPITAL_COMMUNITY)
Admission: RE | Admit: 2020-10-12 | Discharge: 2020-10-12 | Disposition: A | Discharge status: Home / Self Care | Payer: Commercial Managed Care - PPO | Attending: Emergency Medicine | Admitting: Emergency Medicine

## 2020-10-12 ENCOUNTER — Ambulatory Visit: Payer: Commercial Managed Care - PPO | Admitting: Family Medicine

## 2020-10-12 ENCOUNTER — Encounter (HOSPITAL_COMMUNITY): Payer: Self-pay | Admitting: Emergency Medicine

## 2020-10-12 ENCOUNTER — Ambulatory Visit (HOSPITAL_COMMUNITY): Payer: Commercial Managed Care - PPO | Admitting: Anesthesiology

## 2020-10-12 ENCOUNTER — Ambulatory Visit (HOSPITAL_COMMUNITY): Payer: Commercial Managed Care - PPO

## 2020-10-12 ENCOUNTER — Other Ambulatory Visit: Payer: Self-pay

## 2020-10-12 ENCOUNTER — Encounter (HOSPITAL_COMMUNITY)
Admission: RE | Disposition: A | Discharge status: Home / Self Care | Payer: Self-pay | Source: Home / Self Care | Attending: Emergency Medicine

## 2020-10-12 DIAGNOSIS — Z8616 Personal history of COVID-19: Secondary | ICD-10-CM | POA: Insufficient documentation

## 2020-10-12 DIAGNOSIS — Z8249 Family history of ischemic heart disease and other diseases of the circulatory system: Secondary | ICD-10-CM | POA: Diagnosis not present

## 2020-10-12 DIAGNOSIS — E785 Hyperlipidemia, unspecified: Secondary | ICD-10-CM | POA: Insufficient documentation

## 2020-10-12 DIAGNOSIS — Z20822 Contact with and (suspected) exposure to covid-19: Secondary | ICD-10-CM | POA: Diagnosis not present

## 2020-10-12 DIAGNOSIS — Z832 Family history of diseases of the blood and blood-forming organs and certain disorders involving the immune mechanism: Secondary | ICD-10-CM | POA: Diagnosis not present

## 2020-10-12 DIAGNOSIS — Z803 Family history of malignant neoplasm of breast: Secondary | ICD-10-CM | POA: Diagnosis not present

## 2020-10-12 DIAGNOSIS — Z809 Family history of malignant neoplasm, unspecified: Secondary | ICD-10-CM | POA: Diagnosis not present

## 2020-10-12 DIAGNOSIS — R590 Localized enlarged lymph nodes: Secondary | ICD-10-CM | POA: Diagnosis not present

## 2020-10-12 DIAGNOSIS — M7710 Lateral epicondylitis, unspecified elbow: Secondary | ICD-10-CM | POA: Insufficient documentation

## 2020-10-12 DIAGNOSIS — R918 Other nonspecific abnormal finding of lung field: Secondary | ICD-10-CM | POA: Diagnosis not present

## 2020-10-12 DIAGNOSIS — I509 Heart failure, unspecified: Secondary | ICD-10-CM

## 2020-10-12 DIAGNOSIS — Z419 Encounter for procedure for purposes other than remedying health state, unspecified: Secondary | ICD-10-CM

## 2020-10-12 DIAGNOSIS — M1990 Unspecified osteoarthritis, unspecified site: Secondary | ICD-10-CM | POA: Insufficient documentation

## 2020-10-12 HISTORY — PX: VIDEO BRONCHOSCOPY WITH ENDOBRONCHIAL NAVIGATION: SHX6175

## 2020-10-12 HISTORY — PX: BRONCHIAL BRUSHINGS: SHX5108

## 2020-10-12 HISTORY — PX: BRONCHIAL WASHINGS: SHX5105

## 2020-10-12 HISTORY — PX: VIDEO BRONCHOSCOPY WITH ENDOBRONCHIAL ULTRASOUND: SHX6177

## 2020-10-12 HISTORY — PX: BRONCHIAL NEEDLE ASPIRATION BIOPSY: SHX5106

## 2020-10-12 LAB — COMPREHENSIVE METABOLIC PANEL
ALT: 40 U/L (ref 0–44)
AST: 27 U/L (ref 15–41)
Albumin: 3.6 g/dL (ref 3.5–5.0)
Alkaline Phosphatase: 40 U/L (ref 38–126)
Anion gap: 6 (ref 5–15)
BUN: 15 mg/dL (ref 6–20)
CO2: 28 mmol/L (ref 22–32)
Calcium: 8.9 mg/dL (ref 8.9–10.3)
Chloride: 103 mmol/L (ref 98–111)
Creatinine, Ser: 1.11 mg/dL (ref 0.61–1.24)
GFR, Estimated: >60 mL/min (ref >60)
Glucose, Bld: 102 mg/dL — ABNORMAL HIGH (ref 70–99)
Potassium: 4.5 mmol/L (ref 3.5–5.1)
Sodium: 137 mmol/L (ref 135–145)
Total Bilirubin: 1.2 mg/dL (ref 0.3–1.2)
Total Protein: 6.2 g/dL — ABNORMAL LOW (ref 6.5–8.1)

## 2020-10-12 LAB — CBC
HCT: 42.5 % (ref 39.0–52.0)
Hemoglobin: 14.8 g/dL (ref 13.0–17.0)
MCH: 32.3 pg (ref 26.0–34.0)
MCHC: 34.8 g/dL (ref 30.0–36.0)
MCV: 92.8 fL (ref 80.0–100.0)
Platelets: 215 10*3/uL (ref 150–400)
RBC: 4.58 MIL/uL (ref 4.22–5.81)
RDW: 12.4 % (ref 11.5–15.5)
WBC: 5.2 10*3/uL (ref 4.0–10.5)
nRBC: 0 % (ref 0.0–0.2)

## 2020-10-12 LAB — SARS CORONAVIRUS 2 BY RT PCR (HOSPITAL ORDER, PERFORMED IN ~~LOC~~ HOSPITAL LAB): SARS Coronavirus 2: NEGATIVE

## 2020-10-12 LAB — APTT: aPTT: 28 seconds (ref 24–36)

## 2020-10-12 LAB — PROTIME-INR
INR: 1.1 (ref 0.8–1.2)
Prothrombin Time: 14 seconds (ref 11.4–15.2)

## 2020-10-12 SURGERY — VIDEO BRONCHOSCOPY WITH ENDOBRONCHIAL NAVIGATION
Anesthesia: General | Laterality: Bilateral

## 2020-10-12 MED ORDER — LIDOCAINE 2% (20 MG/ML) 5 ML SYRINGE
INTRAMUSCULAR | Status: DC | PRN
  Administered 2020-10-12: 80 mg via INTRAVENOUS

## 2020-10-12 MED ORDER — PHENYLEPHRINE HCL-NACL 10-0.9 MG/250ML-% IV SOLN
INTRAVENOUS | Status: DC | PRN
  Administered 2020-10-12: 20 ug/min via INTRAVENOUS

## 2020-10-12 MED ORDER — SUGAMMADEX SODIUM 200 MG/2ML IV SOLN
INTRAVENOUS | Status: DC | PRN
  Administered 2020-10-12: 250 mg via INTRAVENOUS

## 2020-10-12 MED ORDER — PROPOFOL 10 MG/ML IV BOLUS
INTRAVENOUS | Status: DC | PRN
  Administered 2020-10-12: 200 mg via INTRAVENOUS

## 2020-10-12 MED ORDER — ONDANSETRON HCL 4 MG/2ML IJ SOLN
INTRAMUSCULAR | Status: DC | PRN
  Administered 2020-10-12: 4 mg via INTRAVENOUS

## 2020-10-12 MED ORDER — DEXAMETHASONE SODIUM PHOSPHATE 10 MG/ML IJ SOLN
INTRAMUSCULAR | Status: DC | PRN
  Administered 2020-10-12: 5 mg via INTRAVENOUS

## 2020-10-12 MED ORDER — ROCURONIUM BROMIDE 10 MG/ML (PF) SYRINGE
PREFILLED_SYRINGE | INTRAVENOUS | Status: DC | PRN
  Administered 2020-10-12: 50 mg via INTRAVENOUS

## 2020-10-12 MED ORDER — CHLORHEXIDINE GLUCONATE 0.12 % MT SOLN: 15.0000 mL | Freq: Once | OROMUCOSAL | Status: AC

## 2020-10-12 MED ORDER — MIDAZOLAM HCL 2 MG/2ML IJ SOLN
INTRAMUSCULAR | Status: DC | PRN
  Administered 2020-10-12: 2 mg via INTRAVENOUS

## 2020-10-12 MED ORDER — FENTANYL CITRATE (PF) 250 MCG/5ML IJ SOLN
INTRAMUSCULAR | Status: DC | PRN
  Administered 2020-10-12: 100 ug via INTRAVENOUS

## 2020-10-12 MED ORDER — LACTATED RINGERS IV SOLN: INTRAVENOUS | Status: DC

## 2020-10-12 MED ORDER — ACETAMINOPHEN 500 MG PO TABS
1000.0000 mg | ORAL_TABLET | Freq: Once | ORAL | Status: AC
  Administered 2020-10-12: 1000 mg via ORAL
  Filled 2020-10-12: qty 2

## 2020-10-12 MED ORDER — CHLORHEXIDINE GLUCONATE 0.12 % MT SOLN
OROMUCOSAL | Status: AC
  Administered 2020-10-12: 15 mL via OROMUCOSAL
  Filled 2020-10-12: qty 15

## 2020-10-12 MED ORDER — FENTANYL CITRATE (PF) 100 MCG/2ML IJ SOLN: 25.0000 ug | INTRAMUSCULAR | Status: DC | PRN

## 2020-10-12 MED ORDER — CELECOXIB 200 MG PO CAPS
200.0000 mg | ORAL_CAPSULE | Freq: Once | ORAL | Status: AC
  Administered 2020-10-12: 200 mg via ORAL
  Filled 2020-10-12: qty 1

## 2020-10-12 NOTE — Anesthesia Postprocedure Evaluation (Signed)
Anesthesia Post Note  Patient: Roy Bennett  Procedure(s) Performed: VIDEO BRONCHOSCOPY WITH ENDOBRONCHIAL NAVIGATION (Bilateral ) VIDEO BRONCHOSCOPY WITH ENDOBRONCHIAL ULTRASOUND (Bilateral ) BRONCHIAL BRUSHINGS BRONCHIAL WASHINGS BRONCHIAL NEEDLE ASPIRATION BIOPSIES     Patient location during evaluation: PACU Anesthesia Type: General Level of consciousness: awake and alert Pain management: pain level controlled Vital Signs Assessment: post-procedure vital signs reviewed and stable Respiratory status: spontaneous breathing, nonlabored ventilation and respiratory function stable Cardiovascular status: blood pressure returned to baseline and stable Postop Assessment: no apparent nausea or vomiting Anesthetic complications: no   No complications documented.  Last Vitals:  Vitals:   10/12/20 0920 10/12/20 0935  BP: 118/82 113/76  Pulse: 79 67  Resp: 14 19  Temp:  (!) 36.2 C  SpO2: 98% 100%    Last Pain:  Vitals:   10/12/20 0935  TempSrc:   PainSc: 0-No pain                 Zora Glendenning,W. EDMOND

## 2020-10-12 NOTE — Transfer of Care (Signed)
Immediate Anesthesia Transfer of Care Note  Patient: Roy Bennett  Procedure(s) Performed: VIDEO BRONCHOSCOPY WITH ENDOBRONCHIAL NAVIGATION (Bilateral ) VIDEO BRONCHOSCOPY WITH ENDOBRONCHIAL ULTRASOUND (Bilateral ) BRONCHIAL BRUSHINGS BRONCHIAL WASHINGS BRONCHIAL NEEDLE ASPIRATION BIOPSIES  Patient Location: PACU  Anesthesia Type:General  Level of Consciousness: awake, alert  and patient cooperative  Airway & Oxygen Therapy: Patient Spontanous Breathing and Patient connected to face mask oxygen  Post-op Assessment: Report given to RN and Post -op Vital signs reviewed and stable  Post vital signs: Reviewed and stable  Last Vitals:  Vitals Value Taken Time  BP 134/100   Temp 97.2   Pulse 82   Resp 12   SpO2 97     Last Pain:  Vitals:   10/12/20 0625  TempSrc:   PainSc: 0-No pain         Complications: No complications documented.

## 2020-10-12 NOTE — Progress Notes (Signed)
Waiting for x-ray to be read. Vital signs stable. No further monitoring required.   Gabriel Rainwater, RN

## 2020-10-12 NOTE — Interval H&P Note (Signed)
History and Physical Interval Note:  10/12/2020 7:23 AM  Lacy Duverney  has presented today for surgery, with the diagnosis of BILATERAL PULMONARY NODULES WITH HILAR ADENOPATHY.  The various methods of treatment have been discussed with the patient and family. After consideration of risks, benefits and other options for treatment, the patient has consented to  Procedure(s): [place] (Bilateral) VIDEO BRONCHOSCOPY WITH ENDOBRONCHIAL ULTRASOUND (Bilateral) as a surgical intervention.  The patient's history has been reviewed, patient examined, no change in status, stable for surgery.  I have reviewed the patient's chart and labs.  Questions were answered to the patient's satisfaction.     Collene Gobble

## 2020-10-12 NOTE — Op Note (Signed)
Video Bronchoscopy with Endobronchial Ultrasound and Electromagnetic Navigation Procedure Note  Date of Operation: 10/12/2020  Pre-op Diagnosis: Bilateral pulmonary nodules with mild mediastinal adenopathy  Post-op Diagnosis: Same  Surgeon: Baltazar Apo  Assistants: None  Anesthesia: General endotracheal anesthesia  Operation: Flexible video fiberoptic bronchoscopy with endobronchial ultrasound, electromagnetic navigation and biopsies.  Estimated Blood Loss: Minimal  Complications: None apparent  Indications and History: Roy Bennett is a 55 y.o. male with little past medical history.  He is a never smoker.  Found to have small rounded pulmonary nodules on cardiac calcium CT 09/17/2020, confirmed on dedicated CT chest 09/22/2020, also with some mild mediastinal adenopathy.  A subsequent PET scan showed no hypermetabolism.  Recommendation was made to achieve a tissue diagnosis using endobronchial ultrasound and navigational bronchoscopy. The risks, benefits, complications, treatment options and expected outcomes were discussed with the patient.  The possibilities of pneumothorax, pneumonia, reaction to medication, pulmonary aspiration, perforation of a viscus, bleeding, failure to diagnose a condition and creating a complication requiring transfusion or operation were discussed with the patient who freely signed the consent.    Description of Procedure: The patient was examined in the preoperative area and history and data from the preprocedure consultation were reviewed. It was deemed appropriate to proceed.  The patient was taken to [facility] endoscopy room 2, identified as Lacy Duverney and the procedure verified as Flexible Video Fiberoptic Bronchoscopy.  A Time Out was held and the above information confirmed. After being taken to the operating room general anesthesia was initiated and the patient  was orally intubated. The video fiberoptic bronchoscope was introduced via the endotracheal  tube and a general inspection was performed which showed normal airways throughout.  There were no endobronchial lesions.  The airway mucosa was slightly hyperemic but no gross abnormality. The standard scope was withdrawn and the endobronchial ultrasound was used to identify and characterize the peritracheal, hilar and bronchial lymph nodes. Inspection showed slight enlargement at stations 4R, 7, 11 L. Using real-time ultrasound guidance Wang needle biopsies were take from Station 4R, 7, 11 L nodes and were sent for cytology.   Attention was then turned to the patient's peripheral nodules. Prior to the date of the procedure a high-resolution CT scan of the chest was performed. Utilizing [place] a virtual tracheobronchial tree was generated to allow the creation of distinct navigation pathways to the patient's 2 largest right lower lobe superior segmental nodules, target 1 (poor superior) and target 2 (rounded and inferior posterior). The video fiberoptic bronchoscope was re-introduced via the endotracheal tube. An extendable working channel and locator guide were introduced into the bronchoscope. The distinct navigation pathways prepared prior to this procedure were then utilized to navigate to within 0.7 cm of both right lower lobe targets identified on CT scan.  There was difficulty aiming directly at both targets.  The extendable working channel was secured into place and the locator guide was withdrawn. Under fluoroscopic guidance transbronchial  brushings were performed to be sent for cytology. A bronchioalveolar lavage was performed in the right lower lobe superior segment and sent for cytology and microbiology (bacterial, fungal, AFB smears and cultures). At the end of the procedure a general airway inspection was performed and there was no evidence of active bleeding. The bronchoscope was removed.  The patient tolerated the procedure well. There was no significant blood loss and there were  no obvious complications. A post-procedural chest x-ray is pending.   Samples: 1. Wang needle biopsies from 4R node 2. Mina Marble  needle biopsies from 7 node 3. Wang needle biopsies from 11L node 4. Transbronchial brushings from RLL nodule target 1 5. Transbronchial brushings from RLL nodule target 2  Plans:  The patient will be discharged from the PACU to home when recovered from anesthesia. We will review the cytology and microbiology results with the patient when they become available. Outpatient followup will be with Dr Lamonte Sakai.    Baltazar Apo, MD, PhD 10/12/2020, 9:05 AM [facility] [phone] or if no answer before 7:00PM call [phone] For any issues after 7:00PM please call eLink [phone]

## 2020-10-12 NOTE — Discharge Instructions (Signed)
Flexible Bronchoscopy, Care After This sheet gives you information about how to care for yourself after your test. Your doctor may also give you more specific instructions. If you have problems or questions, contact your doctor. Follow these instructions at home: Eating and drinking  Do not eat or drink anything (not even water) for 2 hours after your test, or until your numbing medicine (local anesthetic) wears off.  When your numbness is gone and your cough and gag reflexes have come back, you may: ? Eat only soft foods. ? Slowly drink liquids.  The day after the test, go back to your normal diet. Driving  Do not drive for 24 hours if you were given a medicine to help you relax (sedative).  Do not drive or use heavy machinery while taking prescription pain medicine. General instructions   Take over-the-counter and prescription medicines only as told by your doctor.  Return to your normal activities as told. Ask what activities are safe for you.  Do not use any products that have nicotine or tobacco in them. This includes cigarettes and e-cigarettes. If you need help quitting, ask your doctor.  Keep all follow-up visits as told by your doctor. This is important. It is very important if you had a tissue sample (biopsy) taken. Get help right away if:  You have shortness of breath that gets worse.  You get light-headed.  You feel like you are going to pass out (faint).  You have chest pain.  You cough up: ? More than a little blood. ? More blood than before. Summary  Do not eat or drink anything (not even water) for 2 hours after your test, or until your numbing medicine wears off.  Do not use cigarettes. Do not use e-cigarettes.  Get help right away if you have chest pain.  Please call our office for any questions or concerns.  [phone].  This information is not intended to replace advice given to you by your health care provider. Make sure you discuss any  questions you have with your health care provider. Document Released: 04/03/2009 Document Revised: 05/19/2017 Document Reviewed: 06/24/2016 Elsevier Patient Education  2020 [employer].

## 2020-10-12 NOTE — Anesthesia Procedure Notes (Signed)
Procedure Name: Intubation Date/Time: 10/12/2020 7:43 AM Performed by: Thelma Comp, CRNA Pre-anesthesia Checklist: Patient identified, Emergency Drugs available, Suction available and Patient being monitored Patient Re-evaluated:Patient Re-evaluated prior to induction Oxygen Delivery Method: Circle System Utilized Preoxygenation: Pre-oxygenation with 100% oxygen Induction Type: IV induction Ventilation: Mask ventilation without difficulty Laryngoscope Size: Mac and 4 Grade View: Grade I Tube type: Oral Tube size: 8.5 mm Number of attempts: 1 Airway Equipment and Method: Stylet Placement Confirmation: ETT inserted through vocal cords under direct vision,  positive ETCO2 and breath sounds checked- equal and bilateral Secured at: 23 cm Tube secured with: Tape Dental Injury: Teeth and Oropharynx as per pre-operative assessment

## 2020-10-13 ENCOUNTER — Ambulatory Visit (HOSPITAL_COMMUNITY): Payer: Commercial Managed Care - PPO

## 2020-10-13 LAB — CYTOLOGY - NON PAP

## 2020-10-13 LAB — ACID FAST SMEAR (AFB, MYCOBACTERIA): Acid Fast Smear: NEGATIVE

## 2020-10-14 ENCOUNTER — Encounter (HOSPITAL_COMMUNITY): Payer: Self-pay | Admitting: Emergency Medicine

## 2020-10-16 ENCOUNTER — Telehealth: Payer: Self-pay | Admitting: Emergency Medicine

## 2020-10-16 ENCOUNTER — Ambulatory Visit: Payer: Commercial Managed Care - PPO | Admitting: Family Medicine

## 2020-10-16 ENCOUNTER — Ambulatory Visit (HOSPITAL_BASED_OUTPATIENT_CLINIC_OR_DEPARTMENT_OTHER): Payer: Commercial Managed Care - PPO | Admitting: Physical Therapy

## 2020-10-16 NOTE — Progress Notes (Deleted)
   I, [name], LAT, ATC, am serving as scribe for Dr. Lynne Leader.  Roy Bennett is a 55 y.o. male who presents to [place] at [facility] today for f/u of R elbow/lateral epicondyle and L knee pain.  He was last seen by Dr. Georgina Snell on 09/08/20 for both of these c/o as well as R distal MT pain and was shown a HEP focusing on forearm extensor eccentrics.  He was advised to wear a wrist splint at night for possible CTS.  He was also prescribed Gabapentin.  He con't PT for his prior L hip pain and has now completed 10 sessions.  Since his last visit, pt reports     Pertinent review of systems: ***  Relevant historical information: ***   Exam:  There were no vitals taken for this visit. General: Well Developed, well nourished, and in no acute distress.   MSK: ***    Lab and Radiology Results No results found for this or any previous visit (from the past 72 hour(s)). DG Chest Port 1 View  Result Date: 10/12/2020 CLINICAL DATA:  Status post bronchoscopy EXAM: PORTABLE CHEST 1 VIEW COMPARISON:  PET-CT September 30, 2020 FINDINGS: No evident pneumothorax. 8 mm nodular opacity right mid lung, better seen by CT. No edema or airspace opacity. Heart is upper normal in size with pulmonary vascularity normal. No overt adenopathy by radiography seen. No bone lesions. IMPRESSION: 8 mm nodular opacity right mid lung. No edema or airspace opacity. No pneumothorax evident. No adenopathy appreciable by radiography. Electronically Signed   By: Lowella Grip III M.D.   On: 10/12/2020 10:50       Assessment and Plan: 55 y.o. male with ***   PDMP not reviewed this encounter. No orders of the defined types were placed in this encounter.  No orders of the defined types were placed in this encounter.    Discussed warning signs or symptoms. Please see discharge instructions. Patient expresses understanding.   ***

## 2020-10-16 NOTE — Telephone Encounter (Signed)
Cytology results with the patient by phone today.  No evidence of malignancy, no granulomas seen.  All of his culture data still pending.  I remain suspicious that this is granulomatous disease.  We will plan to follow with serial imaging to ensure no interval change or any findings to raise our suspicion for malignancy.

## 2020-10-17 LAB — AEROBIC/ANAEROBIC CULTURE W GRAM STAIN (SURGICAL/DEEP WOUND): Culture: NO GROWTH

## 2020-10-19 ENCOUNTER — Ambulatory Visit: Payer: Commercial Managed Care - PPO | Admitting: Family Medicine

## 2020-10-29 ENCOUNTER — Other Ambulatory Visit: Payer: Self-pay

## 2020-10-29 ENCOUNTER — Emergency Department (HOSPITAL_BASED_OUTPATIENT_CLINIC_OR_DEPARTMENT_OTHER)
Admission: EM | Admit: 2020-10-29 | Discharge: 2020-10-29 | Disposition: A | Discharge status: Home / Self Care | Payer: Commercial Managed Care - PPO | Attending: Emergency Medicine | Admitting: Emergency Medicine

## 2020-10-29 ENCOUNTER — Emergency Department (HOSPITAL_BASED_OUTPATIENT_CLINIC_OR_DEPARTMENT_OTHER): Payer: Commercial Managed Care - PPO

## 2020-10-29 ENCOUNTER — Encounter (HOSPITAL_BASED_OUTPATIENT_CLINIC_OR_DEPARTMENT_OTHER): Payer: Self-pay

## 2020-10-29 DIAGNOSIS — W11XXXA Fall on and from ladder, initial encounter: Secondary | ICD-10-CM

## 2020-10-29 DIAGNOSIS — M25571 Pain in right ankle and joints of right foot: Secondary | ICD-10-CM | POA: Diagnosis not present

## 2020-10-29 DIAGNOSIS — S6991XA Unspecified injury of right wrist, hand and finger(s), initial encounter: Secondary | ICD-10-CM | POA: Diagnosis present

## 2020-10-29 DIAGNOSIS — S52571A Other intraarticular fracture of lower end of right radius, initial encounter for closed fracture: Secondary | ICD-10-CM

## 2020-10-29 DIAGNOSIS — S8011XA Contusion of right lower leg, initial encounter: Secondary | ICD-10-CM | POA: Insufficient documentation

## 2020-10-29 DIAGNOSIS — T148XXA Other injury of unspecified body region, initial encounter: Secondary | ICD-10-CM

## 2020-10-29 DIAGNOSIS — M7989 Other specified soft tissue disorders: Secondary | ICD-10-CM | POA: Diagnosis not present

## 2020-10-29 MED ORDER — IBUPROFEN 400 MG PO TABS
600.0000 mg | ORAL_TABLET | Freq: Once | ORAL | Status: AC
  Administered 2020-10-29: 600 mg via ORAL
  Filled 2020-10-29: qty 1

## 2020-10-29 MED ORDER — OXYCODONE-ACETAMINOPHEN 5-325 MG PO TABS: 1.0000 | ORAL_TABLET | ORAL | 0 refills | Status: DC | PRN

## 2020-10-29 NOTE — Discharge Instructions (Signed)
Your imaging today only revealed a fracture in your right wrist as we discussed.  Please keep the splint in place and keep it elevated and you may use the pain medicine help with discomfort.  Please follow-up with your orthopedics team for further management recommendations.  If any symptoms change or worsen acutely, please return to the nearest emergency department.

## 2020-10-29 NOTE — ED Provider Notes (Signed)
[facility] EMERGENCY DEPT Provider Note   CSN: [id number] Arrival date & time: 10/29/20  1700     History Chief Complaint  Patient presents with  . Fall    Roy Bennett is a 55 y.o. male.  The history is provided by the patient and medical records. No language interpreter was used.  Fall This is a new problem. The current episode started 1 to 2 hours ago. The problem occurs constantly. The problem has not changed since onset.Pertinent negatives include no chest pain, no abdominal pain, no headaches and no shortness of breath. Nothing aggravates the symptoms. Nothing relieves the symptoms. He has tried nothing for the symptoms. The treatment provided no relief.       Past Medical History:  Diagnosis Date  . Hyperlipidemia     Patient Active Problem List   Diagnosis Date Noted  . Mediastinal adenopathy   . Pulmonary nodules/lesions, multiple 09/22/2020  . Lateral epicondylitis, right elbow 09/08/2020  . Primary osteoarthritis of left hip 01/03/2020  . Chronic left hip pain 12/09/2019  . Pes planus of both feet 04/15/2014    Past Surgical History:  Procedure Laterality Date  . BRONCHIAL BRUSHINGS  10/12/2020   Procedure: BRONCHIAL BRUSHINGS;  Surgeon: Collene Gobble, MD;  Location: [facility] ENDOSCOPY;  Service: Pulmonary;;  . BRONCHIAL NEEDLE ASPIRATION BIOPSY  10/12/2020   Procedure: BRONCHIAL NEEDLE ASPIRATION BIOPSIES;  Surgeon: Collene Gobble, MD;  Location: [facility];  Service: Pulmonary;;  . BRONCHIAL WASHINGS  10/12/2020   Procedure: BRONCHIAL WASHINGS;  Surgeon: Collene Gobble, MD;  Location: [facility] ENDOSCOPY;  Service: Pulmonary;;  . VIDEO BRONCHOSCOPY WITH ENDOBRONCHIAL NAVIGATION Bilateral 10/12/2020   Procedure: VIDEO BRONCHOSCOPY WITH ENDOBRONCHIAL NAVIGATION;  Surgeon: Collene Gobble, MD;  Location: [facility] ENDOSCOPY;  Service: Pulmonary;  Laterality: Bilateral;  . VIDEO BRONCHOSCOPY WITH ENDOBRONCHIAL ULTRASOUND Bilateral 10/12/2020   Procedure: VIDEO  BRONCHOSCOPY WITH ENDOBRONCHIAL ULTRASOUND;  Surgeon: Collene Gobble, MD;  Location: [facility] ENDOSCOPY;  Service: Pulmonary;  Laterality: Bilateral;       Family History  Problem Relation Age of Onset  . Heart disease Mother   . Clotting disorder Mother   . Cancer Mother     Social History   Tobacco Use  . Smoking status: Never Smoker  . Smokeless tobacco: Never Used  Vaping Use  . Vaping Use: Never used  Substance Use Topics  . Alcohol use: Not Currently    Alcohol/week: 2.0 standard drinks    Types: 2 Standard drinks or equivalent per week  . Drug use: Never    Home Medications Prior to Admission medications   Not on File    Allergies    Patient has no known allergies.  Review of Systems   Review of Systems  Constitutional: Negative for chills, diaphoresis, fatigue and fever.  HENT: Negative for congestion.   Respiratory: Negative for cough, chest tightness, shortness of breath and wheezing.   Cardiovascular: Negative for chest pain.  Gastrointestinal: Negative for abdominal pain, constipation, diarrhea, nausea and vomiting.  Genitourinary: Negative for flank pain.  Musculoskeletal: Negative for back pain, neck pain and neck stiffness.  Skin: Positive for color change and wound.  Neurological: Negative for weakness, light-headedness and headaches.  Psychiatric/Behavioral: Negative for agitation.  All other systems reviewed and are negative.   Physical Exam Updated Vital Signs BP 120/75 (BP Location: Right Arm)   Pulse 73   Temp 98.6 F (37 C) (Oral)   Resp 18   Ht 6\' 5"  (1.956 m)   Wt  122.5 kg   SpO2 98%   BMI 32.02 kg/m   Physical Exam Vitals and nursing note reviewed.  Constitutional:      General: He is not in acute distress.    Appearance: He is well-developed. He is not ill-appearing, toxic-appearing or diaphoretic.  HENT:     Head: Normocephalic and atraumatic.  Eyes:     Conjunctiva/sclera: Conjunctivae normal.  Cardiovascular:     Rate and  Rhythm: Normal rate and regular rhythm.     Heart sounds: No murmur heard.   Pulmonary:     Effort: Pulmonary effort is normal. No respiratory distress.     Breath sounds: Normal breath sounds. No wheezing, rhonchi or rales.  Chest:     Chest wall: No tenderness.  Abdominal:     General: Abdomen is flat.     Palpations: Abdomen is soft.     Tenderness: There is no abdominal tenderness. There is no right CVA tenderness, left CVA tenderness, guarding or rebound.  Musculoskeletal:        General: Swelling, tenderness and signs of injury present.     Right forearm: Tenderness present.     Right wrist: Tenderness present. No snuff box tenderness. Normal pulse.     Right hand: Tenderness present.     Cervical back: Neck supple.     Right lower leg: Tenderness present. No edema.     Left lower leg: No edema.     Right ankle: Swelling present. Tenderness present.     Right foot: Tenderness present.     Comments: Normal sensation, strength, and pulses distally.  Bruising and swelling of the right shin, tenderness of the right ankle with swelling, tenderness of the dorsum of the right foot laterally.  Tenderness and swelling of the right forearm and tenderness and swelling of the right wrist.  Intact radial and ulnar pulse, sensation, and strength in the hand.  Tenderness in the hypothenar area but no snuffbox tenderness.  Skin:    General: Skin is warm and dry.     Capillary Refill: Capillary refill takes less than 2 seconds.     Findings: Bruising present.  Neurological:     General: No focal deficit present.     Mental Status: He is alert.     Sensory: No sensory deficit.     Motor: No weakness.  Psychiatric:        Mood and Affect: Mood normal.     ED Results / Procedures / Treatments   Labs (all labs ordered are listed, but only abnormal results are displayed) Labs Reviewed - No data to display  EKG None  Radiology DG Forearm Right  Result Date: 10/29/2020 CLINICAL DATA:   [place] off ladder EXAM: RIGHT FOREARM - 2 VIEW COMPARISON:  None. FINDINGS: There is no evidence of fracture or other focal bone lesions. Soft tissues are unremarkable. IMPRESSION: Negative. Electronically Signed   By: Donavan Foil M.D.   On: 10/29/2020 19:17   DG Wrist Complete Right  Result Date: 10/29/2020 CLINICAL DATA:  [place] off ladder EXAM: RIGHT WRIST - COMPLETE 3+ VIEW COMPARISON:  None. FINDINGS: Acute nondisplaced intra-articular distal radius fracture. No subluxation IMPRESSION: Acute nondisplaced intra-articular distal radius fracture Electronically Signed   By: Donavan Foil M.D.   On: 10/29/2020 19:18   DG Tibia/Fibula Right  Result Date: 10/29/2020 CLINICAL DATA:  [place] down ladder EXAM: RIGHT TIBIA AND FIBULA - 2 VIEW COMPARISON:  None. FINDINGS: No fracture or malalignment. Edema within the subcutaneous soft tissues.  No radiopaque foreign body IMPRESSION: No acute osseous abnormality Electronically Signed   By: Donavan Foil M.D.   On: 10/29/2020 19:17   DG Ankle 2 Views Right  Result Date: 10/29/2020 CLINICAL DATA:  Right ankle pain after falling off a ladder. EXAM: RIGHT ANKLE - 2 VIEW COMPARISON:  Right lower leg obtained at the same time. FINDINGS: Mild soft tissue swelling. No fracture, dislocation or effusion seen. IMPRESSION: No fracture. Electronically Signed   By: Claudie Revering M.D.   On: 10/29/2020 19:16   DG Hand Complete Right  Result Date: 10/29/2020 CLINICAL DATA:  Fall down ladder EXAM: RIGHT HAND - COMPLETE 3+ VIEW COMPARISON:  None. FINDINGS: Acute nondisplaced intra-articular distal radius fracture. No fracture or malalignment within the right hand. IMPRESSION: Acute nondisplaced intra-articular distal radius fracture Electronically Signed   By: Donavan Foil M.D.   On: 10/29/2020 19:19   DG Foot Complete Right  Result Date: 10/29/2020 CLINICAL DATA:  Right foot pain after falling off a ladder. EXAM: RIGHT FOOT COMPLETE - 3+ VIEW COMPARISON:  None. FINDINGS:  Moderate dorsal navicular spur formation. Minimal calcaneal enthesophyte formation. No fracture or dislocation seen. IMPRESSION: No fracture. Electronically Signed   By: Claudie Revering M.D.   On: 10/29/2020 19:15    Procedures Procedures   Medications Ordered in ED Medications  ibuprofen (ADVIL) tablet 600 mg (600 mg Oral Given 10/29/20 1922)    ED Course  I have reviewed the triage vital signs and the nursing notes.  Pertinent labs & imaging results that were available during my care of the patient were reviewed by me and considered in my medical decision making (see chart for details).    MDM Rules/Calculators/A&P                          Lindell Renfrew is a 55 y.o. male with a past medical history significant for hyperlipidemia who presents with traumatic injuries after fall down a ladder.  He reports that he was on a ladder this afternoon approximately 6 feet in the air when he reported falling and bumped his leg on the ladder.  He reports injuries to his right hand, right wrist, right mid forearm, right mid shin, right ankle, and right foot.  He denied hitting his head, neck pain, neck stiffness, or new back pains.  Denies any abdominal pain or any left-sided injuries.  He reports there is an bruising and swelling and abrasions.  He denies numbness, tingling, or weakness but has significant pain with palpation and movement.  No other complaints reported.  On exam, lungs clear and chest nontender but abdomen nontender, hips nontender.  Patient has tenderness in the right mid forearm with some bruising and abrasion.  Tenderness and swelling of the right wrist but he did have pain with wrist manipulation.  Tenderness in the right hypothenar area but no snuffbox tenderness.  He is right-handed.  Normal capillary refill, sensation, and strength of the fingers.  No deep laceration seen.  Patient has bruising and swelling of the right mid shin with some abrasion, tenderness and swelling of the right  lateral ankle, and pain of the right midfoot on the plantar surface.  Intact sensation and strength of the toes and intact pulses.  Patient otherwise well-appearing.  We will get x-rays of all the injured areas.  Anticipate discharge with likely soft tissue injuries with close follow-up if work-up is reassuring.  7:59 PM X-rays show evidence of distal intra-articular radius  fracture without displacement or subluxation.  Suspect this is the cause of the patient's wrist pain.  No evidence of other injuries from a bony standpoint.  Patient was placed into a sugar-tong splint and will follow-up with hand orthopedic surgery.  Patient had no neurologic deficits on exam and was intact and sensation, strength, and pulses.  After splinting, anticipate discharge to follow-up with orthopedics and prescription for pain medicine.   Splint applied and patient reassessed and he has normal sensation and strength in finger still.  Patient will follow up as we discussed and was discharged in good condition.  Final Clinical Impression(s) / ED Diagnoses Final diagnoses:  Other closed intra-articular fracture of distal end of right radius, initial encounter  Fall from ladder, initial encounter  Bruising    Rx / DC Orders ED Discharge Orders         Ordered    oxyCODONE-acetaminophen (PERCOCET/ROXICET) 5-325 MG tablet  Every 4 hours PRN        10/29/20 2059         Clinical Impression: 1. Other closed intra-articular fracture of distal end of right radius, initial encounter   2. Fall from ladder, initial encounter   3. Bruising     Disposition: Discharge  Condition: Good  I have discussed the results, Dx and Tx plan with the pt(& family if present). He/she/they expressed understanding and agree(s) with the plan. Discharge instructions discussed at great length. Strict return precautions discussed and pt &/or family have verbalized understanding of the instructions. No further questions at time of  discharge.    Discharge Medication List as of 10/29/2020  8:59 PM    START taking these medications   Details  oxyCODONE-acetaminophen (PERCOCET/ROXICET) 5-325 MG tablet Take 1 tablet by mouth every 4 (four) hours as needed for severe pain., Starting Thu 10/29/2020, Normal        Follow Up: Marisa [facility] [address] [place] [id number] [phone]     [facility] [place] [phone]       Hashim Eichhorst, Gwenyth Allegra, MD 10/30/20 [phone]

## 2020-10-29 NOTE — ED Notes (Signed)
Ice Packs applied to right arm and wrist

## 2020-10-29 NOTE — ED Triage Notes (Signed)
Patient slipped down a 6 foot ladder, "catching myself a couple times on the way down with my right arm".  Swelling noted to right wrist, contusion to right shin, swelling to right foot

## 2020-11-03 ENCOUNTER — Other Ambulatory Visit: Payer: Self-pay

## 2020-11-03 ENCOUNTER — Ambulatory Visit (INDEPENDENT_AMBULATORY_CARE_PROVIDER_SITE_OTHER): Payer: Commercial Managed Care - PPO | Admitting: Emergency Medicine

## 2020-11-03 ENCOUNTER — Encounter: Payer: Self-pay | Admitting: Emergency Medicine

## 2020-11-03 DIAGNOSIS — R918 Other nonspecific abnormal finding of lung field: Secondary | ICD-10-CM

## 2020-11-03 NOTE — Assessment & Plan Note (Signed)
In the setting of his mediastinal adenopathy suspicious for granulomatous disease.  Biopsies were reassuring.  PET scan reassuring.  We will repeat his CT scan of the chest in October to look for interval stability or change.  If there is a change then we will consider either repeat bronchoscopy or possibly mediastinoscopy.

## 2020-11-03 NOTE — Patient Instructions (Signed)
We will repeat your CT scan of the chest with contrast in October 2022 Follow Dr. Lamonte Sakai in October after your CT so that we can review the results together.

## 2020-11-03 NOTE — Addendum Note (Signed)
Addended by: Gavin Potters R on: 11/03/2020 02:56 PM   Modules accepted: Orders

## 2020-11-03 NOTE — Progress Notes (Signed)
   Subjective:    Patient ID: Roy Bennett, male    DOB: August 13, 1965, 55 y.o.   MRN: [id number]  HPI 55 year old never smoker with a history of hyperlipidemia, osteoarthritis, lateral epicondylitis of his right elbow, thyroid nodule, heterozygous for hemachromatosis.    He underwent CT cardiac calcium evaluation on 09/17/2020 that identified pulmonary nodularity that was not seen on a previous CT chest from 2016.  This prompted a dedicated CT chest performed 09/22/2020 which I have reviewed.  This showed some mild nonspecific mediastinal adenopathy, largest was at station 4R 1.3 cm.  There was a well-circumscribed 11 mm nodule, 5 mm nodules in the right lower lobe, bandlike cluster of subpleural nodularity posterior aspect of the superior right lower lobe all new since 2016, largest 7 mm. He had COVID x 2 in the last 2 years. He had a reassuring CSY, benign polyps.   No hx renal failure Reassuring PSA 10/2019  ROV 11/03/20 --Roy Bennett is 51, follows up today for pulmonary nodular disease and mediastinal lymphadenopathy noted on CT scan of the chest.  PET scan 09/30/2020 showed no evidence of hypermetabolism.  He underwent bronchoscopy on 4/25 with 1 needle biopsies of his nodes and sampling of right lower lobe nodularity.  There is no evidence of malignancy or granulomas.  There is normal lymphoid tissue.  AFB and fungal cultures negative.   Review of Systems As per HPI      Objective:   Physical Exam Vitals:   11/03/20 1436  BP: 130/74  Pulse: 75  Temp: 98.1 F (36.7 C)  TempSrc: Temporal  SpO2: 97%  Weight: 276 lb 9.6 oz (125.5 kg)  Height: 6\' 5"  (1.956 m)   Gen: Pleasant, well-nourished, in no distress,  normal affect  ENT: No lesions,  mouth clear,  oropharynx clear, no postnasal drip  Neck: No JVD, no stridor  Lungs: No use of accessory muscles, no crackles or wheezing on normal respiration, no wheeze on forced expiration  Cardiovascular: RRR, heart sounds normal, no murmur or  gallops, no peripheral edema  Musculoskeletal: No deformities, no cyanosis or clubbing  Neuro: alert, awake, non focal  Skin: Warm, no lesions or rash      Assessment & Plan:  Pulmonary nodules/lesions, multiple In the setting of his mediastinal adenopathy suspicious for granulomatous disease.  Biopsies were reassuring.  PET scan reassuring.  We will repeat his CT scan of the chest in October to look for interval stability or change.  If there is a change then we will consider either repeat bronchoscopy or possibly mediastinoscopy.   Baltazar Apo, MD, PhD 11/03/2020, 2:52 PM [facility] [phone] or if no answer before 7:00PM call [phone] For any issues after 7:00PM please call eLink [phone]

## 2020-11-11 LAB — FUNGUS CULTURE WITH STAIN

## 2020-11-11 LAB — FUNGUS CULTURE RESULT

## 2020-11-11 LAB — FUNGAL ORGANISM REFLEX

## 2020-11-24 LAB — ACID FAST CULTURE WITH REFLEXED SENSITIVITIES (MYCOBACTERIA): Acid Fast Culture: NEGATIVE

## 2021-04-07 ENCOUNTER — Ambulatory Visit
Admission: RE | Admit: 2021-04-07 | Discharge: 2021-04-07 | Disposition: A | Discharge status: Home / Self Care | Payer: Commercial Managed Care - PPO | Source: Ambulatory Visit | Attending: Emergency Medicine | Admitting: Emergency Medicine

## 2021-04-07 DIAGNOSIS — R918 Other nonspecific abnormal finding of lung field: Secondary | ICD-10-CM

## 2021-04-07 MED ORDER — IOPAMIDOL (ISOVUE-300) INJECTION 61%
75.0000 mL | Freq: Once | INTRAVENOUS | Status: AC | PRN
  Administered 2021-04-07: 75 mL via INTRAVENOUS

## 2021-09-03 ENCOUNTER — Other Ambulatory Visit: Payer: Self-pay

## 2021-09-03 DIAGNOSIS — R911 Solitary pulmonary nodule: Secondary | ICD-10-CM

## 2021-09-17 ENCOUNTER — Other Ambulatory Visit: Payer: Self-pay

## 2021-09-17 ENCOUNTER — Emergency Department (HOSPITAL_BASED_OUTPATIENT_CLINIC_OR_DEPARTMENT_OTHER)
Admission: EM | Admit: 2021-09-17 | Discharge: 2021-09-17 | Disposition: A | Discharge status: Home / Self Care | Payer: Commercial Managed Care - PPO | Attending: Emergency Medicine | Admitting: Emergency Medicine

## 2021-09-17 ENCOUNTER — Encounter (HOSPITAL_BASED_OUTPATIENT_CLINIC_OR_DEPARTMENT_OTHER): Payer: Self-pay

## 2021-09-17 ENCOUNTER — Emergency Department (HOSPITAL_BASED_OUTPATIENT_CLINIC_OR_DEPARTMENT_OTHER): Payer: Commercial Managed Care - PPO

## 2021-09-17 DIAGNOSIS — R519 Headache, unspecified: Secondary | ICD-10-CM | POA: Insufficient documentation

## 2021-09-17 DIAGNOSIS — M542 Cervicalgia: Secondary | ICD-10-CM | POA: Diagnosis present

## 2021-09-17 DIAGNOSIS — H538 Other visual disturbances: Secondary | ICD-10-CM | POA: Insufficient documentation

## 2021-09-17 DIAGNOSIS — R110 Nausea: Secondary | ICD-10-CM | POA: Insufficient documentation

## 2021-09-17 DIAGNOSIS — R42 Dizziness and giddiness: Secondary | ICD-10-CM | POA: Insufficient documentation

## 2021-09-17 DIAGNOSIS — Z20822 Contact with and (suspected) exposure to covid-19: Secondary | ICD-10-CM | POA: Insufficient documentation

## 2021-09-17 DIAGNOSIS — J029 Acute pharyngitis, unspecified: Secondary | ICD-10-CM | POA: Insufficient documentation

## 2021-09-17 LAB — CBC WITH DIFFERENTIAL/PLATELET
Abs Immature Granulocytes: 0.01 10*3/uL (ref 0.00–0.07)
Basophils Absolute: 0 10*3/uL (ref 0.0–0.1)
Basophils Relative: 1 %
Eosinophils Absolute: 0.2 10*3/uL (ref 0.0–0.5)
Eosinophils Relative: 4 %
HCT: 39.1 % (ref 39.0–52.0)
Hemoglobin: 14 g/dL (ref 13.0–17.0)
Immature Granulocytes: 0 %
Lymphocytes Relative: 30 %
Lymphs Abs: 2 10*3/uL (ref 0.7–4.0)
MCH: 32.1 pg (ref 26.0–34.0)
MCHC: 35.8 g/dL (ref 30.0–36.0)
MCV: 89.7 fL (ref 80.0–100.0)
Monocytes Absolute: 0.5 10*3/uL (ref 0.1–1.0)
Monocytes Relative: 8 %
Neutro Abs: 3.8 10*3/uL (ref 1.7–7.7)
Neutrophils Relative %: 57 %
Platelets: 202 10*3/uL (ref 150–400)
RBC: 4.36 MIL/uL (ref 4.22–5.81)
RDW: 11.8 % (ref 11.5–15.5)
WBC: 6.6 10*3/uL (ref 4.0–10.5)
nRBC: 0 % (ref 0.0–0.2)

## 2021-09-17 LAB — BASIC METABOLIC PANEL
Anion gap: 9 (ref 5–15)
BUN: 17 mg/dL (ref 6–20)
CO2: 27 mmol/L (ref 22–32)
Calcium: 9.2 mg/dL (ref 8.9–10.3)
Chloride: 103 mmol/L (ref 98–111)
Creatinine, Ser: 1.17 mg/dL (ref 0.61–1.24)
GFR, Estimated: >60 mL/min (ref >60)
Glucose, Bld: 87 mg/dL (ref 70–99)
Potassium: 4 mmol/L (ref 3.5–5.1)
Sodium: 139 mmol/L (ref 135–145)

## 2021-09-17 LAB — RESP PANEL BY RT-PCR (FLU A&B, COVID) ARPGX2
Influenza A by PCR: NEGATIVE
Influenza B by PCR: NEGATIVE
SARS Coronavirus 2 by RT PCR: NEGATIVE

## 2021-09-17 MED ORDER — IOHEXOL 350 MG/ML SOLN
100.0000 mL | Freq: Once | INTRAVENOUS | Status: AC | PRN
  Administered 2021-09-17: 75 mL via INTRAVENOUS

## 2021-09-17 NOTE — Discharge Instructions (Addendum)
The CT scan of your neck did not show any acute abnormality.  It could be that there is a small lymph node in flames as a result of a viral illness.  I recommend warm compresses to the left side of your neck Tylenol and ibuprofen as discussed below and follow-up with your primary care provider.  You may return to the ER for any new or concerning symptoms.  Primarily any numbness slurred speech confusion weakness of arms or legs or drooping of your mouth.  Or any other new or concerning symptoms --if use means any of the symptoms please call 911. ?

## 2021-09-17 NOTE — ED Triage Notes (Signed)
Patient c/o left sided neck swelling and pain since yesterday causing some nausea. States he has been feeling overall bad with some dizziness  ?

## 2021-09-17 NOTE — ED Provider Notes (Signed)
?[facility] EMERG[facility] DEPT ?Provider Note ? ? ?CSN: [id number] ?Arrival date & time: 09/17/21  1819 ? ?  ? ?History ? ?Chief Complaint  ?Patient presents with  ? Sore Throat  ? ? ?Timmothy Baranowski is a 56 y.o. male. ? ? ?Sore Throat ?Patient is a 56 year old male with no pertinent past medical history presented emergency room today with complaints of right-sided neck pain seems that this came on over the past 24 hours since, relatively abruptly.  He states over the past 24 hours he has had some nausea as well as some lightheadedness he also states that he has had some left eye blurry vision states that this has not been severe and seems to be some intermittent.  Denies any eye pain.  States he has a mild headache but not severe.  Denies any chest pain difficulty breathing no numbness or weakness slurred speech confusion no neck trauma or head trauma. ? ?  ? ?Home Medications ?Prior to Admission medications   ?Medication Sig Start Date End Date Taking? Authorizing Provider  ?HYDROcodone-acetaminophen (NORCO/VICODIN) 5-325 MG tablet Take 1 tablet by mouth every 6 (six) hours as needed. 11/02/20   [provider]  ?oxyCODONE-acetaminophen (PERCOCET/ROXICET) 5-325 MG tablet Take 1 tablet by mouth every 4 (four) hours as needed for severe pain. 10/29/20   Tegeler, Gwenyth Allegra, MD  ?   ? ?Allergies    ?Patient has no known allergies.   ? ?Review of Systems   ?Review of Systems ? ?Physical Exam ?Updated Vital Signs ?BP 118/70 (BP Location: Right Arm)   Pulse (!) 56   Temp 98.6 ?F (37 ?C)   Resp 18   Ht '6\' 5"'$  (1.956 m)   Wt 125.2 kg   SpO2 95%   BMI 32.73 kg/m?  ?Physical Exam ?Vitals and nursing note reviewed.  ?Constitutional:   ?   General: He is not in acute distress. ?HENT:  ?   Head: Normocephalic and atraumatic.  ?   Nose: Nose normal.  ?Eyes:  ?   General: No scleral icterus. ?Cardiovascular:  ?   Rate and Rhythm: Normal rate and regular rhythm.  ?   Pulses: Normal pulses.  ?   Heart  sounds: Normal heart sounds.  ?Pulmonary:  ?   Effort: Pulmonary effort is normal. No respiratory distress.  ?   Breath sounds: No wheezing.  ?Abdominal:  ?   Palpations: Abdomen is soft.  ?   Tenderness: There is no abdominal tenderness. There is no guarding or rebound.  ?Musculoskeletal:  ?   Cervical back: Normal range of motion.  ?   Right lower leg: No edema.  ?   Left lower leg: No edema.  ?Skin: ?   General: Skin is warm and dry.  ?   Capillary Refill: Capillary refill takes less than 2 seconds.  ?Neurological:  ?   Mental Status: He is alert. Mental status is at baseline.  ?Psychiatric:     ?   Mood and Affect: Mood normal.     ?   Behavior: Behavior normal.  ? ? ?ED Results / Procedures / Treatments   ?Labs ?(all labs ordered are listed, but only abnormal results are displayed) ?Labs Reviewed  ?RESP PANEL BY RT-PCR (FLU A&B, COVID) [id number]  ?CBC WITH DIFFERENTIAL/PLATELET  ?BASIC METABOLIC PANEL  ? ? ?EKG ?None ? ?Radiology ?No results found. ? ?Procedures ?Procedures  ? ? ?Medications Ordered in ED ?Medications - No data to display ? ?ED Course/ Medical Decision Making/ A&P ?  ?                        ?  Medical Decision Making ?Amount and/or Complexity of Data Reviewed ?Labs: ordered. ?Radiology: ordered. ? ? ?Patient is a 56 year old male with no pertinent past medical history presented emergency room today with complaints of right-sided neck pain seems that this came on over the past 24 hours since, relatively abruptly.  He states over the past 24 hours he has had some nausea as well as some lightheadedness he also states that he has had some left eye blurry vision states that this has not been severe and seems to be some intermittent.  Denies any eye pain.  States he has a mild headache but not severe.  Denies any chest pain difficulty breathing no numbness or weakness slurred speech confusion no neck trauma or head trauma. ? ? ?Physical exam relatively unremarkable.  Patient is neurologically intact  well-appearing.?  Lymph node in left neck.  No auscultated bruit. ? ?We will obtain CBC BMP COVID influenza and CT angio neck with contrast. ? ?If if work-up unremarkable anticipate discharge home.  We will follow-up with PCP. ? ?Patient is agreeable to plan.  Is declining any analgesia for headache/neck pain and states that it is relatively mild. ? ?9:33 PM Care of [facility] transferred to Dr. Almyra Free at the end of my shift as the patient will require reassessment once labs/imaging have resulted. Patient presentation, ED course, and plan of care discussed with review of all pertinent labs and imaging. Please see his/her note for further details regarding further ED course and disposition. Plan at time of handoff is discharge home w PCP follow up. This may be altered or completely changed at the discretion of the oncoming team pending results of further workup. ? ? ?Final Clinical Impression(s) / ED Diagnoses ?Final diagnoses:  ?Neck pain  ? ? ?Rx / DC Orders ?ED Discharge Orders   ? ? None  ? ?  ? ? ?  ?Tedd Sias, [place] ?09/17/21 2133 ? ?  ?Luna Fuse, MD ?09/24/21 1752 ? ?

## 2021-09-30 ENCOUNTER — Encounter (HOSPITAL_BASED_OUTPATIENT_CLINIC_OR_DEPARTMENT_OTHER): Payer: Self-pay | Admitting: Emergency Medicine

## 2021-09-30 ENCOUNTER — Other Ambulatory Visit: Payer: Self-pay

## 2021-09-30 ENCOUNTER — Emergency Department (HOSPITAL_BASED_OUTPATIENT_CLINIC_OR_DEPARTMENT_OTHER): Payer: Commercial Managed Care - PPO

## 2021-09-30 ENCOUNTER — Emergency Department (HOSPITAL_BASED_OUTPATIENT_CLINIC_OR_DEPARTMENT_OTHER)
Admission: EM | Admit: 2021-09-30 | Discharge: 2021-09-30 | Disposition: A | Discharge status: Home / Self Care | Payer: Commercial Managed Care - PPO | Attending: Emergency Medicine | Admitting: Emergency Medicine

## 2021-09-30 DIAGNOSIS — K6289 Other specified diseases of anus and rectum: Secondary | ICD-10-CM | POA: Insufficient documentation

## 2021-09-30 DIAGNOSIS — R1032 Left lower quadrant pain: Secondary | ICD-10-CM | POA: Insufficient documentation

## 2021-09-30 LAB — CBC WITH DIFFERENTIAL/PLATELET
Abs Immature Granulocytes: 0.01 10*3/uL (ref 0.00–0.07)
Basophils Absolute: 0.1 10*3/uL (ref 0.0–0.1)
Basophils Relative: 1 %
Eosinophils Absolute: 0.2 10*3/uL (ref 0.0–0.5)
Eosinophils Relative: 4 %
HCT: 42.3 % (ref 39.0–52.0)
Hemoglobin: 14.8 g/dL (ref 13.0–17.0)
Immature Granulocytes: 0 %
Lymphocytes Relative: 32 %
Lymphs Abs: 1.9 10*3/uL (ref 0.7–4.0)
MCH: 31.7 pg (ref 26.0–34.0)
MCHC: 35 g/dL (ref 30.0–36.0)
MCV: 90.6 fL (ref 80.0–100.0)
Monocytes Absolute: 0.5 10*3/uL (ref 0.1–1.0)
Monocytes Relative: 9 %
Neutro Abs: 3.2 10*3/uL (ref 1.7–7.7)
Neutrophils Relative %: 54 %
Platelets: 229 10*3/uL (ref 150–400)
RBC: 4.67 MIL/uL (ref 4.22–5.81)
RDW: 12 % (ref 11.5–15.5)
WBC: 5.9 10*3/uL (ref 4.0–10.5)
nRBC: 0 % (ref 0.0–0.2)

## 2021-09-30 LAB — BASIC METABOLIC PANEL
Anion gap: 9 (ref 5–15)
BUN: 16 mg/dL (ref 6–20)
CO2: 24 mmol/L (ref 22–32)
Calcium: 8.9 mg/dL (ref 8.9–10.3)
Chloride: 104 mmol/L (ref 98–111)
Creatinine, Ser: 1.12 mg/dL (ref 0.61–1.24)
GFR, Estimated: >60 mL/min (ref >60)
Glucose, Bld: 92 mg/dL (ref 70–99)
Potassium: 3.9 mmol/L (ref 3.5–5.1)
Sodium: 137 mmol/L (ref 135–145)

## 2021-09-30 LAB — OCCULT BLOOD X 1 CARD TO LAB, STOOL: Fecal Occult Bld: POSITIVE — AB

## 2021-09-30 MED ORDER — IOHEXOL 300 MG/ML  SOLN
100.0000 mL | Freq: Once | INTRAMUSCULAR | Status: AC | PRN
  Administered 2021-09-30: 100 mL via INTRAVENOUS

## 2021-09-30 MED ORDER — FENTANYL CITRATE PF 50 MCG/ML IJ SOSY
100.0000 ug | PREFILLED_SYRINGE | Freq: Once | INTRAMUSCULAR | Status: AC
  Administered 2021-09-30: 100 ug via INTRAVENOUS
  Filled 2021-09-30 (×2): qty 2

## 2021-09-30 MED ORDER — AMOXICILLIN-POT CLAVULANATE 875-125 MG PO TABS
1.0000 | ORAL_TABLET | Freq: Two times a day (BID) | ORAL | 0 refills | Status: DC
Start: 2021-09-30 — End: 2022-03-31

## 2021-09-30 MED ORDER — AMOXICILLIN-POT CLAVULANATE 875-125 MG PO TABS
1.0000 | ORAL_TABLET | Freq: Once | ORAL | Status: AC
Start: 2021-09-30 — End: 2021-09-30
  Administered 2021-09-30: 1 via ORAL
  Filled 2021-09-30: qty 1

## 2021-09-30 MED ORDER — HYDROCORT-PRAMOXINE (PERIANAL) 1-1 % EX FOAM: 1.0000 | Freq: Two times a day (BID) | CUTANEOUS | 1 refills | Status: DC

## 2021-09-30 MED ORDER — ONDANSETRON HCL 4 MG/2ML IJ SOLN
4.0000 mg | Freq: Once | INTRAMUSCULAR | Status: AC
  Administered 2021-09-30: 4 mg via INTRAVENOUS
  Filled 2021-09-30: qty 2

## 2021-09-30 NOTE — ED Notes (Signed)
Patient transported to CT 

## 2021-09-30 NOTE — ED Provider Notes (Signed)
? ?[facility] ?Provider Note: Georgena Spurling, MD, [place] ? ?CSN: [id number] ?MRN: [id number] ?ARRIVAL: 09/30/21 at 0436 ?ROOM: DB010/DB010 ? ? ?CHIEF COMPLAINT  ?Rectal Pain ? ? ?HISTORY OF PRESENT ILLNESS  ?09/30/21 4:57 AM ?Roy Bennett is a 56 y.o. male with a 1 week history of rectal pain.  The pain is present persistently but is exacerbated by bowel movements.  His stools have been looser than usual and are sometimes blood-streaked.  He has also noticed blood streaking on the toilet tissue.  This morning his pain worsened and he is now having some pain in the left lower quadrant.  He currently rates his pain as an 8 out of 10.  He denies any external masses of his anus.  He denies nausea or vomiting.  He had a colonoscopy on 07/19/2018 at which time a benign polyp was excised. ? ? ?Past Medical History:  ?Diagnosis Date  ? Hyperlipidemia   ? ? ?Past Surgical History:  ?Procedure Laterality Date  ? BRONCHIAL BRUSHINGS  10/12/2020  ? Procedure: BRONCHIAL BRUSHINGS;  Surgeon: Collene Gobble, MD;  Location: [facility] ENDOSCOPY;  Service: Pulmonary;;  ? BRONCHIAL NEEDLE ASPIRATION BIOPSY  10/12/2020  ? Procedure: BRONCHIAL NEEDLE ASPIRATION BIOPSIES;  Surgeon: Collene Gobble, MD;  Location: [facility] ENDOSCOPY;  Service: Pulmonary;;  ? BRONCHIAL WASHINGS  10/12/2020  ? Procedure: BRONCHIAL WASHINGS;  Surgeon: Collene Gobble, MD;  Location: [facility] ENDOSCOPY;  Service: Pulmonary;;  ? VIDEO BRONCHOSCOPY WITH ENDOBRONCHIAL NAVIGATION Bilateral 10/12/2020  ? Procedure: VIDEO BRONCHOSCOPY WITH ENDOBRONCHIAL NAVIGATION;  Surgeon: Collene Gobble, MD;  Location: [facility] ENDOSCOPY;  Service: Pulmonary;  Laterality: Bilateral;  ? VIDEO BRONCHOSCOPY WITH ENDOBRONCHIAL ULTRASOUND Bilateral 10/12/2020  ? Procedure: VIDEO BRONCHOSCOPY WITH ENDOBRONCHIAL ULTRASOUND;  Surgeon: Collene Gobble, MD;  Location: [facility] ENDOSCOPY;  Service: Pulmonary;  Laterality: Bilateral;  ? ? ?Family History  ?Problem Relation Age of Onset  ? Heart disease Mother   ?  Clotting disorder Mother   ? Cancer Mother   ? ? ?Social History  ? ?Tobacco Use  ? Smoking status: Never  ? Smokeless tobacco: Never  ?Vaping Use  ? Vaping Use: Never used  ?Substance Use Topics  ? Alcohol use: Not Currently  ?  Alcohol/week: 2.0 standard drinks  ?  Types: 2 Standard drinks or equivalent per week  ? Drug use: Never  ? ? ?Prior to Admission medications   ?Not on File  ? ? ?Allergies ?Patient has no known allergies. ? ? ?REVIEW OF SYSTEMS  ?Negative except as noted here or in the History of Present Illness. ? ? ?PHYSICAL EXAMINATION  ?Initial Vital Signs ?Blood pressure (!) 135/102, pulse 67, temperature 98.1 ?F (36.7 ?C), temperature source Oral, resp. rate 18, SpO2 98 %. ? ?Examination ?General: Well-developed, well-nourished male in no acute distress; appearance consistent with age of record ?HENT: normocephalic; atraumatic ?Eyes: pupils equal, round and reactive to light; extraocular muscles intact ?Neck: supple ?Heart: regular rate and rhythm ?Lungs: clear to auscultation bilaterally ?Abdomen: soft; nondistended; nontender; bowel sounds present ?Rectal: No external hemorrhoids; normal sphincter tone; significant pain on digital rectal examination; no gross blood or stool on examining glove ?Extremities: No deformity; full range of motion; pulses normal ?Neurologic: Awake, alert and oriented; motor function intact in all extremities and symmetric; no facial droop ?Skin: Warm and dry ?Psychiatric: Normal mood and affect ? ? ?RESULTS  ?Summary of this visit's results, reviewed and interpreted by myself: ? ? EKG Interpretation ? ?Date/Time:    ?Ventricular Rate:    ?  PR Interval:    ?QRS Duration:   ?QT Interval:    ?QTC Calculation:   ?R Axis:     ?Text Interpretation:   ?  ? ?  ? ?Laboratory Studies: ?Results for orders placed or performed during the hospital encounter of 09/30/21 (from the past 24 hour(s))  ?Occult blood card to lab, stool Provider will collect     Status: Abnormal  ? Collection  Time: 09/30/21  4:55 AM  ?Result Value Ref Range  ? Fecal Occult Bld POSITIVE (A) NEGATIVE  ?CBC with Differential/Platelet     Status: None  ? Collection Time: 09/30/21  5:11 AM  ?Result Value Ref Range  ? WBC 5.9 4.0 - 10.5 K/uL  ? RBC 4.67 4.22 - 5.81 MIL/uL  ? Hemoglobin 14.8 13.0 - 17.0 g/dL  ? HCT 42.3 39.0 - 52.0 %  ? MCV 90.6 80.0 - 100.0 fL  ? MCH 31.7 26.0 - 34.0 pg  ? MCHC 35.0 30.0 - 36.0 g/dL  ? RDW 12.0 11.5 - 15.5 %  ? Platelets 229 150 - 400 K/uL  ? nRBC 0.0 0.0 - 0.2 %  ? Neutrophils Relative % 54 %  ? Neutro Abs 3.2 1.7 - 7.7 K/uL  ? Lymphocytes Relative 32 %  ? Lymphs Abs 1.9 0.7 - 4.0 K/uL  ? Monocytes Relative 9 %  ? Monocytes Absolute 0.5 0.1 - 1.0 K/uL  ? Eosinophils Relative 4 %  ? Eosinophils Absolute 0.2 0.0 - 0.5 K/uL  ? Basophils Relative 1 %  ? Basophils Absolute 0.1 0.0 - 0.1 K/uL  ? Immature Granulocytes 0 %  ? Abs Immature Granulocytes 0.01 0.00 - 0.07 K/uL  ?Basic metabolic panel     Status: None  ? Collection Time: 09/30/21  5:11 AM  ?Result Value Ref Range  ? Sodium 137 135 - 145 mmol/L  ? Potassium 3.9 3.5 - 5.1 mmol/L  ? Chloride 104 98 - 111 mmol/L  ? CO2 24 22 - 32 mmol/L  ? Glucose, Bld 92 70 - 99 mg/dL  ? BUN 16 6 - 20 mg/dL  ? Creatinine, Ser 1.12 0.61 - 1.24 mg/dL  ? Calcium 8.9 8.9 - 10.3 mg/dL  ? GFR, Estimated >60 >60 mL/min  ? Anion gap 9 5 - 15  ? ?Imaging Studies: ?CT ABDOMEN PELVIS W CONTRAST ? ?Result Date: 09/30/2021 ?CLINICAL DATA:  Left lower quadrant and rectal pain. EXAM: CT ABDOMEN AND PELVIS WITH CONTRAST TECHNIQUE: Multidetector CT imaging of the abdomen and pelvis was performed using the standard protocol following bolus administration of intravenous contrast. RADIATION DOSE REDUCTION: This exam was performed according to the departmental dose-optimization program which includes automated exposure control, adjustment of the mA and/or kV according to patient size and/or use of iterative reconstruction technique. CONTRAST:  13m OMNIPAQUE IOHEXOL 300 MG/ML   SOLN COMPARISON:  Chest CT 04/07/2021 FINDINGS: Lower chest: 11 mm right lower lobe pulmonary nodule on image 1/series 4 is stable since prior chest CT of 04/07/2021. That chest CT documented multiple bilateral pulmonary nodules and recommended 12-18 month follow-up from the 04/07/2021 exam. Hepatobiliary: No suspicious focal abnormality within the liver parenchyma. There is no evidence for gallstones, gallbladder wall thickening, or pericholecystic fluid. No intrahepatic or extrahepatic biliary dilation. Pancreas: No focal mass lesion. No dilatation of the main duct. No intraparenchymal cyst. No peripancreatic edema. Spleen: No splenomegaly. No focal mass lesion. Adrenals/Urinary Tract: No adrenal nodule or mass. Kidneys unremarkable. Central sinus cysts noted in the left kidney. No followup recommended. No  evidence for hydroureter. The urinary bladder appears normal for the degree of distention. Stomach/Bowel: Stomach is unremarkable. No gastric wall thickening. No evidence of outlet obstruction. Duodenum is normally positioned as is the ligament of Treitz. No small bowel wall thickening. No small bowel dilatation. The terminal ileum is normal. The appendix is not well visualized, but there is no edema or inflammation in the region of the cecum. No gross colonic mass. No colonic wall thickening. Mild diverticular changes are noted in the left colon without evidence of diverticulitis. There is no evidence for perirectal or perianal fluid collection to suggest abscess. No findings to suggest perianal fistula. Vascular/Lymphatic: No abdominal aortic aneurysm. No abdominal aortic atherosclerotic calcification. There is no gastrohepatic or hepatoduodenal ligament lymphadenopathy. No retroperitoneal or mesenteric lymphadenopathy. No pelvic sidewall lymphadenopathy. Reproductive: The prostate gland and seminal vesicles are unremarkable. Other: No intraperitoneal free fluid. Musculoskeletal: No worrisome lytic or  sclerotic osseous abnormality. IMPRESSION: 1. No acute findings in the abdomen or pelvis. Specifically, no findings to explain the patient's history of left lower quadrant pain and rectal pain. No evidence for diver

## 2021-09-30 NOTE — ED Triage Notes (Signed)
Pt presents for LLQ pain and  pain in rectum x 1 week, getting worse. ? ?Made worse by voiding, wiping.  ? ?Denies N/V. ? ?EDP at bedside in triage.  ? ?

## 2022-03-16 ENCOUNTER — Emergency Department (HOSPITAL_BASED_OUTPATIENT_CLINIC_OR_DEPARTMENT_OTHER): Payer: Commercial Managed Care - PPO | Admitting: Radiology

## 2022-03-16 ENCOUNTER — Emergency Department (HOSPITAL_BASED_OUTPATIENT_CLINIC_OR_DEPARTMENT_OTHER): Payer: Commercial Managed Care - PPO

## 2022-03-16 ENCOUNTER — Other Ambulatory Visit (HOSPITAL_BASED_OUTPATIENT_CLINIC_OR_DEPARTMENT_OTHER): Payer: Self-pay

## 2022-03-16 ENCOUNTER — Emergency Department (HOSPITAL_BASED_OUTPATIENT_CLINIC_OR_DEPARTMENT_OTHER)
Admission: EM | Admit: 2022-03-16 | Discharge: 2022-03-16 | Disposition: A | Discharge status: Home / Self Care | Payer: Commercial Managed Care - PPO | Attending: Emergency Medicine | Admitting: Emergency Medicine

## 2022-03-16 ENCOUNTER — Other Ambulatory Visit: Payer: Self-pay

## 2022-03-16 ENCOUNTER — Encounter (HOSPITAL_BASED_OUTPATIENT_CLINIC_OR_DEPARTMENT_OTHER): Payer: Self-pay | Admitting: Radiology

## 2022-03-16 DIAGNOSIS — R110 Nausea: Secondary | ICD-10-CM | POA: Insufficient documentation

## 2022-03-16 DIAGNOSIS — R0602 Shortness of breath: Secondary | ICD-10-CM | POA: Insufficient documentation

## 2022-03-16 DIAGNOSIS — R42 Dizziness and giddiness: Secondary | ICD-10-CM | POA: Diagnosis not present

## 2022-03-16 DIAGNOSIS — Z20822 Contact with and (suspected) exposure to covid-19: Secondary | ICD-10-CM | POA: Diagnosis not present

## 2022-03-16 LAB — CBC
HCT: 43.7 % (ref 39.0–52.0)
Hemoglobin: 15.6 g/dL (ref 13.0–17.0)
MCH: 31.9 pg (ref 26.0–34.0)
MCHC: 35.7 g/dL (ref 30.0–36.0)
MCV: 89.4 fL (ref 80.0–100.0)
Platelets: 268 10*3/uL (ref 150–400)
RBC: 4.89 MIL/uL (ref 4.22–5.81)
RDW: 11.9 % (ref 11.5–15.5)
WBC: 6.4 10*3/uL (ref 4.0–10.5)
nRBC: 0 % (ref 0.0–0.2)

## 2022-03-16 LAB — HEPATIC FUNCTION PANEL
ALT: 18 U/L (ref 0–44)
AST: 15 U/L (ref 15–41)
Albumin: 4.2 g/dL (ref 3.5–5.0)
Alkaline Phosphatase: 51 U/L (ref 38–126)
Bilirubin, Direct: 0.2 mg/dL (ref 0.0–0.2)
Indirect Bilirubin: 0.6 mg/dL (ref 0.3–0.9)
Total Bilirubin: 0.8 mg/dL (ref 0.3–1.2)
Total Protein: 6.7 g/dL (ref 6.5–8.1)

## 2022-03-16 LAB — BASIC METABOLIC PANEL
Anion gap: 9 (ref 5–15)
BUN: 11 mg/dL (ref 6–20)
CO2: 27 mmol/L (ref 22–32)
Calcium: 9.6 mg/dL (ref 8.9–10.3)
Chloride: 102 mmol/L (ref 98–111)
Creatinine, Ser: 0.98 mg/dL (ref 0.61–1.24)
GFR, Estimated: >60 mL/min (ref >60)
Glucose, Bld: 95 mg/dL (ref 70–99)
Potassium: 4.3 mmol/L (ref 3.5–5.1)
Sodium: 138 mmol/L (ref 135–145)

## 2022-03-16 LAB — BRAIN NATRIURETIC PEPTIDE: B Natriuretic Peptide: 23.1 pg/mL (ref 0.0–100.0)

## 2022-03-16 LAB — TROPONIN I (HIGH SENSITIVITY)
Troponin I (High Sensitivity): 4 ng/L (ref <18)
Troponin I (High Sensitivity): 4 ng/L (ref <18)

## 2022-03-16 LAB — SARS CORONAVIRUS 2 BY RT PCR: SARS Coronavirus 2 by RT PCR: NEGATIVE

## 2022-03-16 LAB — D-DIMER, QUANTITATIVE: D-Dimer, Quant: 0.29 ug/mL-FEU (ref 0.00–0.50)

## 2022-03-16 MED ORDER — DIAZEPAM 5 MG PO TABS
5.0000 mg | ORAL_TABLET | Freq: Once | ORAL | Status: AC
  Administered 2022-03-16: 5 mg via ORAL
  Filled 2022-03-16 (×2): qty 1

## 2022-03-16 MED ORDER — ONDANSETRON HCL 4 MG/2ML IJ SOLN
4.0000 mg | Freq: Once | INTRAMUSCULAR | Status: AC
  Administered 2022-03-16: 4 mg via INTRAVENOUS
  Filled 2022-03-16: qty 2

## 2022-03-16 MED ORDER — SODIUM CHLORIDE 0.9 % IV BOLUS
1000.0000 mL | Freq: Once | INTRAVENOUS | Status: AC
Start: 2022-03-16 — End: 2022-03-16
  Administered 2022-03-16: 1000 mL via INTRAVENOUS

## 2022-03-16 MED ORDER — DIAZEPAM 5 MG PO TABS
5.0000 mg | ORAL_TABLET | Freq: Two times a day (BID) | ORAL | 0 refills | Status: DC | PRN
  Filled 2022-03-16: qty 10, 5d supply, fill #0

## 2022-03-16 MED ORDER — MECLIZINE HCL 25 MG PO TABS
25.0000 mg | ORAL_TABLET | Freq: Once | ORAL | Status: AC
  Administered 2022-03-16: 25 mg via ORAL
  Filled 2022-03-16: qty 1

## 2022-03-16 NOTE — Discharge Instructions (Addendum)
Work-up today was reassuring.  We did discuss possibly going over to [facility] for an MRI.  If you change your mind, your symptoms worsen please go to [facility] emergency department for further evaluation as we do not have MRI here.  I have written you for a short course of the Valium here for your dizziness, return for new or worsening symptoms.

## 2022-03-16 NOTE — ED Provider Notes (Signed)
[facility] Provider Note   CSN: [id number] Arrival date & time: 03/16/22  4098    History  Chief Complaint  Patient presents with   Dizziness    Roy Bennett is a 56 y.o. male history of pulmonary nodules, mediastinal adenopathy here for evaluation of dizziness.  Patient states started this morning upon waking up.  He has a difficult time describing this.  States he feels like he wants to pass out however also states that is a "odd sensation in his head.  Denies any sudden onset thunderclap headache.  No vision changes.  He feels weak all over however denies any focal unilateral weakness, numbness.  No vision changes.  No slurred speech.  He has never had a thing like this previously.  Also feels short of breath.  No chest tightness, cough.  Has had some nausea without vomiting.  He was feeling otherwise well up until he woke up today.  Wife is in the room who does not have any similar symptoms.  No recent sick contacts.  No fever, congestion, rhinorrhea, abdominal pain.  Having normal bowel movements.  No change in urination.  No swelling or pain to lower extremities.  Personal history of PE or DVT however does state he has a family history of prior VTE's.  No recent surgery, immobilization or malignancy.  HPI     Home Medications Prior to Admission medications   Medication Sig Start Date End Date Taking? Authorizing Provider  diazepam (VALIUM) 5 MG tablet Take 1 tablet (5 mg total) by mouth every 12 (twelve) hours as needed for anxiety. 03/16/22  Yes Eddy Termine A, PA-C  amoxicillin-clavulanate (AUGMENTIN) 875-125 MG tablet Take 1 tablet by mouth 2 (two) times daily. One po bid x 7 days 09/30/21   Molpus, John, MD  clindamycin (CLINDAGEL) 1 % gel Apply topically. 02/21/22   [provider]  hydrocortisone (ANUSOL-HC) 2.5 % rectal cream Apply topically 2 (two) times daily. 03/09/22   [provider]  hydrocortisone-pramoxine  (PROCTOFOAM-HC) rectal foam Place 1 applicator rectally 2 (two) times daily. 09/30/21   Molpus, John, MD  lidocaine (LINDAMANTLE) 3 % CREA cream Apply topically as directed. 02/28/22   [provider]  traMADol (ULTRAM) 50 MG tablet Take 50 mg by mouth 4 (four) times daily as needed. 03/09/22   [provider]      Allergies    Patient has no known allergies.    Review of Systems   Review of Systems  Constitutional:  Positive for activity change and fatigue.  HENT: Negative.    Eyes: Negative.   Respiratory:  Positive for shortness of breath. Negative for apnea, cough, choking, chest tightness, wheezing and stridor.   Cardiovascular: Negative.   Gastrointestinal:  Positive for nausea. Negative for abdominal distention, abdominal pain, anal bleeding, constipation, diarrhea and vomiting.  Genitourinary: Negative.   Musculoskeletal: Negative.   Skin: Negative.   Neurological:  Positive for dizziness, weakness and light-headedness. Negative for tremors, seizures, syncope, facial asymmetry, speech difficulty, numbness and headaches.  All other systems reviewed and are negative.   Physical Exam Updated Vital Signs BP 128/83   Pulse (!) 52   Temp 98.4 F (36.9 C) (Oral)   Resp 15   Ht '6\' 5"'$  (1.956 m)   Wt 117.9 kg   SpO2 100%   BMI 30.83 kg/m  Physical Exam Vitals and nursing note reviewed.  Constitutional:      General: He is not in acute distress.    Appearance:  He is well-developed. He is not ill-appearing, toxic-appearing or diaphoretic.  HENT:     Head: Normocephalic and atraumatic.     Nose: Nose normal.     Mouth/Throat:     Mouth: Mucous membranes are moist.     Comments: PO clear Eyes:     Pupils: Pupils are equal, round, and reactive to light.     Comments: PERRLA, no nystagmus  Neck:     Comments: No midline tenderness Cardiovascular:     Rate and Rhythm: Normal rate and regular rhythm.     Pulses: Normal pulses.          Radial pulses are 2+  on the right side and 2+ on the left side.       Dorsalis pedis pulses are 2+ on the right side and 2+ on the left side.     Heart sounds: Normal heart sounds.  Pulmonary:     Effort: Pulmonary effort is normal. No respiratory distress.     Breath sounds: Normal breath sounds.     Comments: Clear bil, speaks in full sentences without difficulty Abdominal:     General: Bowel sounds are normal. There is no distension.     Palpations: Abdomen is soft.     Comments: Soft  non tender, no rebound or guarding  Musculoskeletal:        General: No swelling, tenderness, deformity or signs of injury. Normal range of motion.     Cervical back: Normal range of motion and neck supple.     Right lower leg: No edema.     Left lower leg: No edema.     Comments: No bony tenderness, compartments soft, full range of motion, Homan negative bilaterally  Skin:    General: Skin is warm and dry.     Capillary Refill: Capillary refill takes less than 2 seconds.     Comments: No edema, erythema or warmth.  Neurological:     General: No focal deficit present.     Mental Status: He is alert and oriented to person, place, and time.     Comments: CN 2-12 grossly intact Equal hand grip Intact sensation Normal F2N No drift    ED Results / Procedures / Treatments   Labs (all labs ordered are listed, but only abnormal results are displayed) Labs Reviewed  SARS CORONAVIRUS 2 BY RT PCR  BASIC METABOLIC PANEL  CBC  HEPATIC FUNCTION PANEL  BRAIN NATRIURETIC PEPTIDE  D-DIMER, QUANTITATIVE  TROPONIN I (HIGH SENSITIVITY)  TROPONIN I (HIGH SENSITIVITY)    EKG EKG Interpretation  Date/Time:  Wednesday March 16 2022 10:10:10 EDT Ventricular Rate:  63 PR Interval:  174 QRS Duration: 98 QT Interval:  412 QTC Calculation: 421 R Axis:   60 Text Interpretation: Normal sinus rhythm Normal ECG No previous ECGs available no prior ECG for comparison. No STEMI Confirmed by Antony Blackbird [phone]) on 03/16/2022  10:17:59 AM  Radiology CT Head Wo Contrast  Result Date: 03/16/2022 CLINICAL DATA:  Headache EXAM: CT HEAD WITHOUT CONTRAST TECHNIQUE: Contiguous axial images were obtained from the base of the skull through the vertex without intravenous contrast. RADIATION DOSE REDUCTION: This exam was performed according to the departmental dose-optimization program which includes automated exposure control, adjustment of the mA and/or kV according to patient size and/or use of iterative reconstruction technique. COMPARISON:  None Available. FINDINGS: Brain: No evidence of acute infarction, hemorrhage, hydrocephalus, extra-axial collection or mass lesion/mass effect. Vascular: No hyperdense vessel or unexpected calcification. Skull: Normal. Negative for  fracture or focal lesion. Sinuses/Orbits: No acute finding. Other: None. IMPRESSION: No acute intracranial abnormality. Electronically Signed   By: Yetta Glassman M.D.   On: 03/16/2022 15:02   DG Chest 2 View  Result Date: 03/16/2022 CLINICAL DATA:  Shortness of breath, dizziness EXAM: CHEST - 2 VIEW COMPARISON:  10/12/2020 FINDINGS: The heart size and mediastinal contours are within normal limits. Both lungs are clear. The visualized skeletal structures are unremarkable. IMPRESSION: No active cardiopulmonary disease. Electronically Signed   By: Elmer Picker M.D.   On: 03/16/2022 11:26    Procedures Procedures    Medications Ordered in ED Medications  sodium chloride 0.9 % bolus 1,000 mL (0 mLs Intravenous Stopped 03/16/22 1358)  ondansetron (ZOFRAN) injection 4 mg (4 mg Intravenous Given 03/16/22 1237)  meclizine (ANTIVERT) tablet 25 mg (25 mg Oral Given 03/16/22 1242)  diazepam (VALIUM) tablet 5 mg (5 mg Oral Given 03/16/22 1601)    ED Course/ Medical Decision Making/ A&P    50 old here for evaluation nonspecific dizziness.  Has difficult time describing this.  Associated shortness of breath, nausea.  No sudden onset" headache.  He has a nonfocal  neuro exam without deficits.  His heart and lungs are clear.  No personal history of VTE however apparently does have significant family history.  No recent surgery, mobilization malignancy.  No obvious evidence of VTE on exam.  He denies any gross chest pain however does admit to shortness of breath without cough, congestion or rhinorrhea.  No sick contacts.  Some nausea without vomiting.  Abdomen soft, nontender, no change in bowel movements, urination.  We will plan on labs, imaging and reassess  Labs and imaging personally viewed and interpreted:  CBC without leukocytosis BMP without significant abnormality Trop 4>>4 COVID neg BNP 23 Hep fun panel wo abnormality D-dimer 0.29 Xray chest without edema, pneumothorax, infiltrates, cardiomegaly EKG without ischemic changes CT head without acute abnormality  Patient reassessed.  Some mild improvement with meclizine.  Will attempt Valium.  Reassessed.  States he feels better.  I did discuss possibly going over to [facility] for MRI to rule out posterior CVA however patient states he feels better, and will follow-up if his symptoms do not improve or worsen.  I feel this is reasonable.  DC home a short course of Valium.  Of note he has had some heart rates in the 50s during his visit today however this appears at baseline when looking at his prior visits.  I have low suspicion for heart block, symptomatic bradycardia as cause of his symptoms.  The patient has been appropriately medically screened and/or stabilized in the ED. I have low suspicion for any other emergent medical condition which would require further screening, evaluation or treatment in the ED or require inpatient management.  Patient is hemodynamically stable and in no acute distress.  Patient able to ambulate in department prior to ED.  Evaluation does not show acute pathology that would require ongoing or additional emergent interventions while in the emergency department or further  inpatient treatment.  I have discussed the diagnosis with the patient and answered all questions.  Pain is been managed while in the emergency department and patient has no further complaints prior to discharge.  Patient is comfortable with plan discussed in room and is stable for discharge at this time.  I have discussed strict return precautions for returning to the emergency department.  Patient was encouraged to follow-up with PCP/specialist refer to at discharge.  Medical Decision Making Amount and/or Complexity of Data Reviewed Independent Historian: spouse External Data Reviewed: labs, radiology, ECG and notes. Labs: ordered. Decision-making details documented in ED Course. Radiology: ordered and independent interpretation performed. Decision-making details documented in ED Course. ECG/medicine tests: ordered and independent interpretation performed. Decision-making details documented in ED Course.  Risk OTC drugs. Prescription drug management. Parenteral controlled substances. Decision regarding hospitalization. Diagnosis or treatment significantly limited by social determinants of health.          Final Clinical Impression(s) / ED Diagnoses Final diagnoses:  Dizziness  SOB (shortness of breath)    Rx / DC Orders ED Discharge Orders          Ordered    diazepam (VALIUM) 5 MG tablet  Every 12 hours PRN        03/16/22 1622              Keiarra Charon A, PA-C 03/16/22 1627    Tegeler, Gwenyth Allegra, MD 03/17/22 2136

## 2022-03-16 NOTE — ED Triage Notes (Signed)
Pt POV in wheelchair with wife. Pt caox4. Pt c/o weakness, light-headedness, SOB, nausea since waking up around 6 this morning. Pt denies CP. Significant cardiac family hx.

## 2022-03-22 ENCOUNTER — Ambulatory Visit (HOSPITAL_BASED_OUTPATIENT_CLINIC_OR_DEPARTMENT_OTHER): Payer: Commercial Managed Care - PPO

## 2022-03-31 ENCOUNTER — Ambulatory Visit (INDEPENDENT_AMBULATORY_CARE_PROVIDER_SITE_OTHER): Payer: Commercial Managed Care - PPO | Admitting: Emergency Medicine

## 2022-03-31 ENCOUNTER — Other Ambulatory Visit (HOSPITAL_BASED_OUTPATIENT_CLINIC_OR_DEPARTMENT_OTHER): Payer: Self-pay

## 2022-03-31 ENCOUNTER — Encounter: Payer: Self-pay | Admitting: Emergency Medicine

## 2022-03-31 DIAGNOSIS — R918 Other nonspecific abnormal finding of lung field: Secondary | ICD-10-CM

## 2022-03-31 NOTE — Progress Notes (Signed)
Subjective:    Patient ID: Roy Bennett, male    DOB: February 24, 1966, 56 y.o.   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    He underwent CT cardiac calcium evaluation on 09/17/2020 that identified pulmonary nodularity that was not seen on a previous CT chest from 2016.  This prompted a dedicated CT chest performed 09/22/2020 which I have reviewed.  This showed some mild nonspecific mediastinal adenopathy, largest was at station 4R 1.3 cm.  There was a well-circumscribed 11 mm nodule, 5 mm nodules in the right lower lobe, bandlike cluster of subpleural nodularity posterior aspect of the superior right lower lobe all new since 2016, largest 7 mm. He had COVID x 2 in the last 2 years. He had a reassuring CSY, benign polyps.   No hx renal failure Reassuring PSA 10/2019  ROV 11/03/20 --Roy Bennett is 69, follows up today for pulmonary nodular disease and mediastinal lymphadenopathy noted on CT scan of the chest.  PET scan 09/30/2020 showed no evidence of hypermetabolism.  He underwent bronchoscopy on 4/25 with 1 needle biopsies of his nodes and sampling of right lower lobe nodularity.  There is no evidence of malignancy or granulomas.  There is normal lymphoid tissue.  AFB and fungal cultures negative.   ROV 03/31/22 --56 year old man who is a never smoker with a history of hyperlipidemia, OSA, heterozygosity for hemochromatosis.  I have seen him for pulmonary nodule noted on a cardiac calcium CT March 2022.  This prompted bronchoscopy 10/12/2020 that showed no evidence of malignancy or granulomatous disease.  All of his cultures were negative.  Repeat CT 04/07/2021 showed that his pulmonary nodules are unchanged in size or appearance, up to 11 mm in the right lower lobe. He reports that his breathing is doing well. No cough.   CT scan of the abdomen and pelvis done  09/30/2021 did show that the 11 mm right lower lobe nodule remained stable.   Review of Systems As per HPI      Objective:   Physical Exam Vitals:   03/31/22 1612  BP: 116/76  Pulse: 64  Temp: 98.2 F (36.8 C)  TempSrc: Oral  SpO2: 97%  Weight: 263 lb (119.3 kg)  Height: '6\' 5"'$  (1.956 m)   Gen: Pleasant, well-nourished, in no distress,  normal affect  ENT: No lesions,  mouth clear,  oropharynx clear, no postnasal drip  Neck: No JVD, no stridor  Lungs: No use of accessory muscles, no crackles or wheezing on normal respiration, no wheeze on forced expiration  Cardiovascular: RRR, heart sounds normal, no murmur or gallops, no peripheral edema  Musculoskeletal: No deformities, no cyanosis or clubbing  Neuro: alert, awake, non focal  Skin: Warm, no lesions or rash      Assessment & Plan:  Pulmonary nodules/lesions, multiple Principal nodule is around 11 mm nodule in the right lower lobe.  Negative on biopsy and stable on serial CT scans.  The most recent was a CT of the abdomen done 09/30/2021.  We will plan to repeat a CT chest April 2024.  If stable at that time then we would have 2 years of establish stability.  Can likely stop following serial scans at that time unless he has clinical change.   Roy Apo, MD, PhD 03/31/2022, 4:26 PM [facility] [phone] or if no answer before 7:00PM call [phone] For any issues after 7:00PM please call  eLink [phone]

## 2022-03-31 NOTE — Assessment & Plan Note (Signed)
Principal nodule is around 11 mm nodule in the right lower lobe.  Negative on biopsy and stable on serial CT scans.  The most recent was a CT of the abdomen done 09/30/2021.  We will plan to repeat a CT chest April 2024.  If stable at that time then we would have 2 years of establish stability.  Can likely stop following serial scans at that time unless he has clinical change.

## 2022-03-31 NOTE — Addendum Note (Signed)
Addended byOralia Rud M on: 03/31/2022 04:53 PM   Modules accepted: Orders

## 2022-03-31 NOTE — Patient Instructions (Signed)
We will plan to repeat your CT scan of the chest in April 2024 to follow your pulmonary nodules. Follow Dr. Lamonte Sakai in April after your CT so we can review the results together, sooner if you have any problems.

## 2022-05-26 IMAGING — DX DG FOOT COMPLETE 3+V*R*
2 series · 3 of 3 positions shown · non-contrast
Comparison: None.

CLINICAL DATA: Right foot pain after falling off a ladder.

EXAM:
RIGHT FOOT COMPLETE - 3+ VIEW

[Series 1: foot · 0.14mm/px · 2 of 2 slices shown]
[im 1/2]
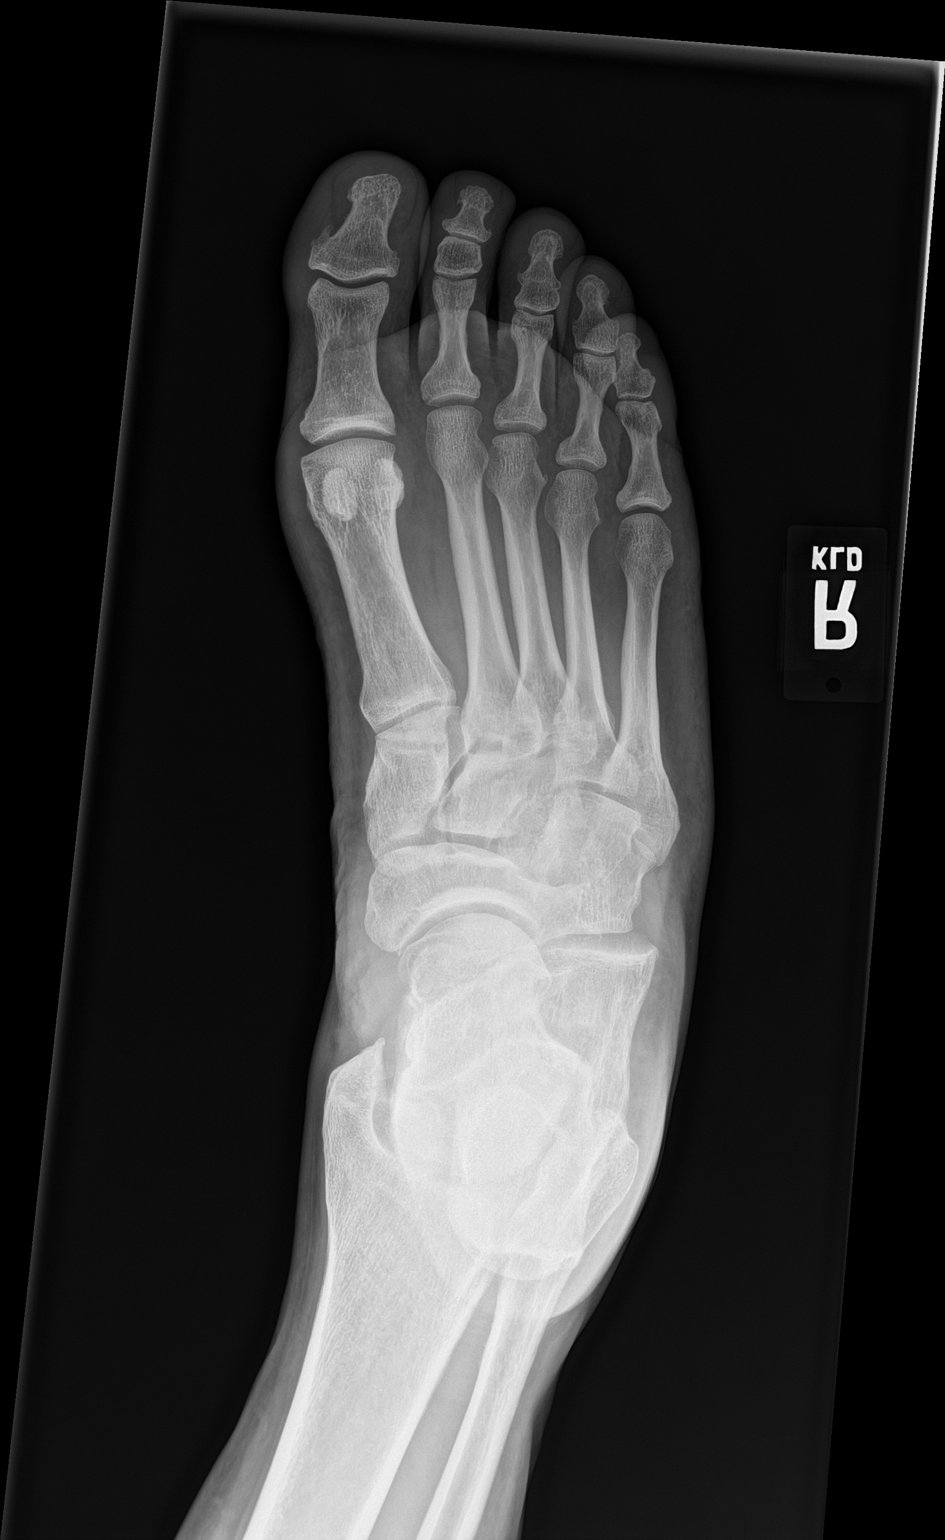
[im 2/2]
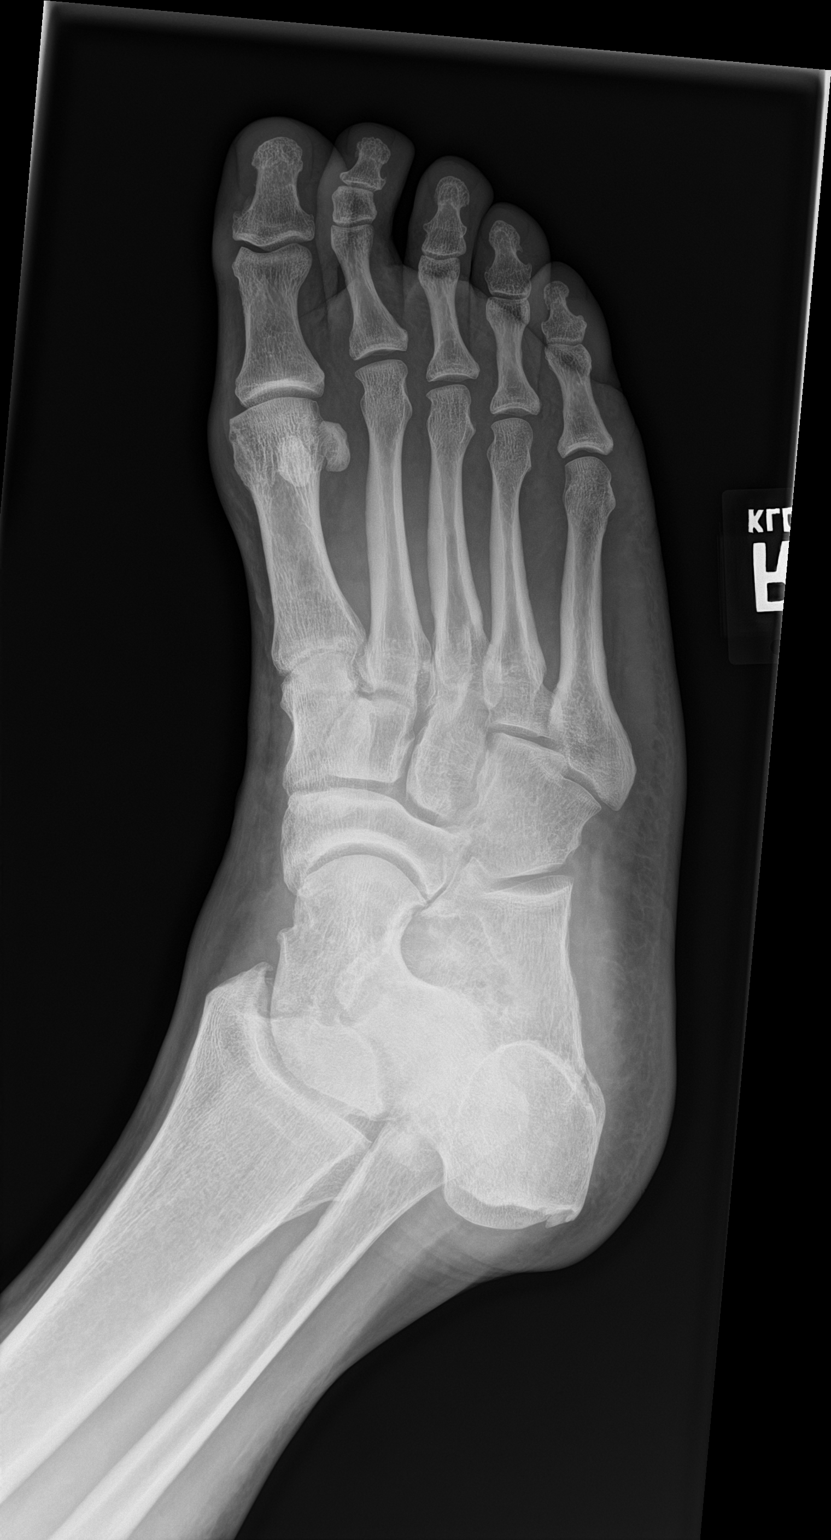

[leg]
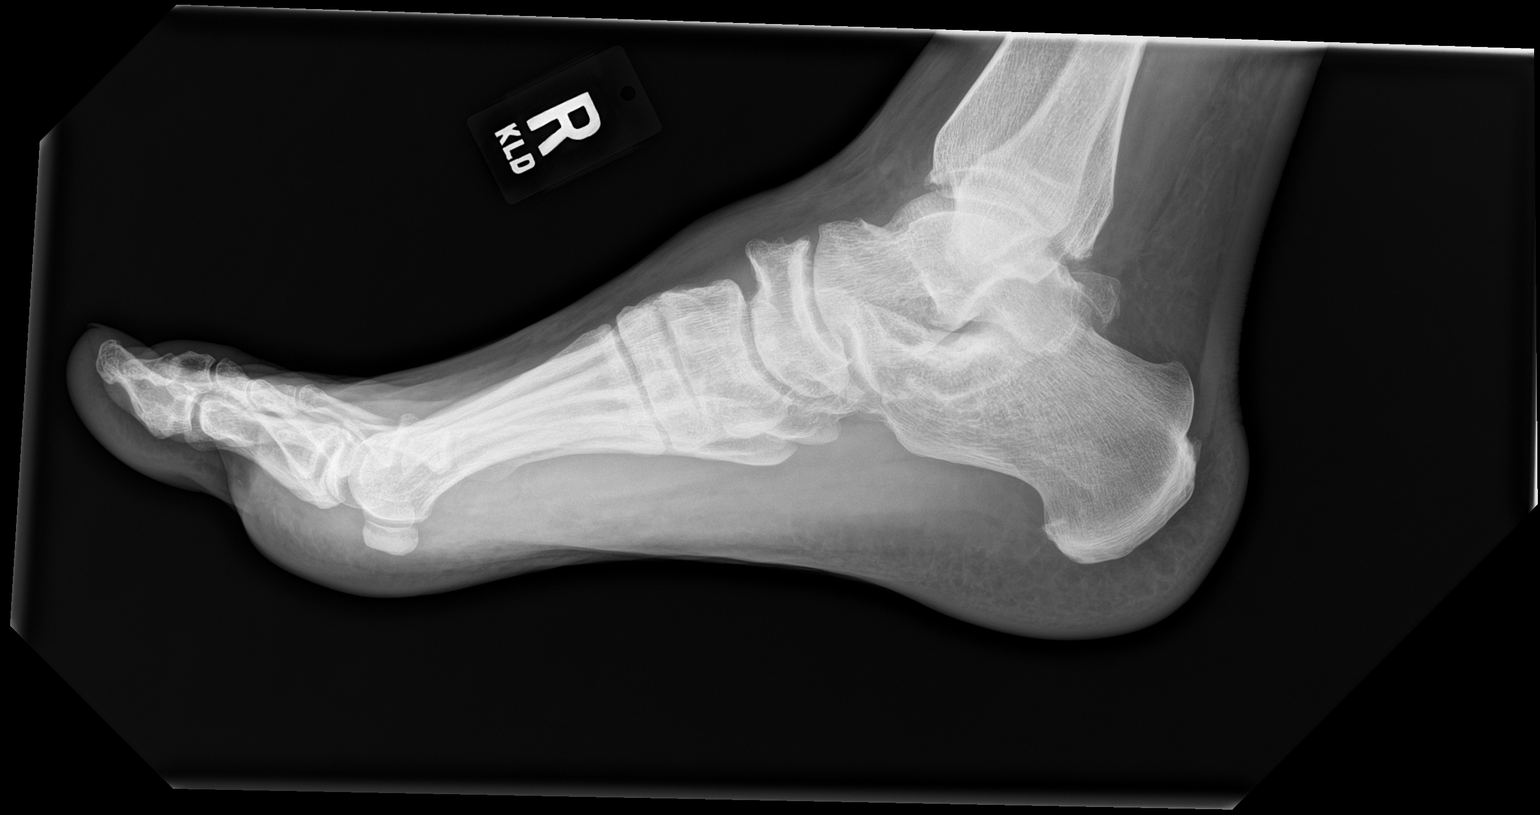

[3 of 3 positions shown; findings below may reference images not displayed]

FINDINGS: Moderate dorsal navicular spur formation. Minimal calcaneal
enthesophyte formation. No fracture or dislocation seen.
IMPRESSION: No fracture.

## 2022-05-26 IMAGING — DX DG TIBIA/FIBULA 2V*R*
1 series · 4 of 4 positions shown · non-contrast
Comparison: None.

CLINICAL DATA: Fell down ladder

EXAM:
RIGHT TIBIA AND FIBULA - 2 VIEW

[Series 1: leg · 0.14mm/px · 4 of 4 slices shown]
[im 1/4]
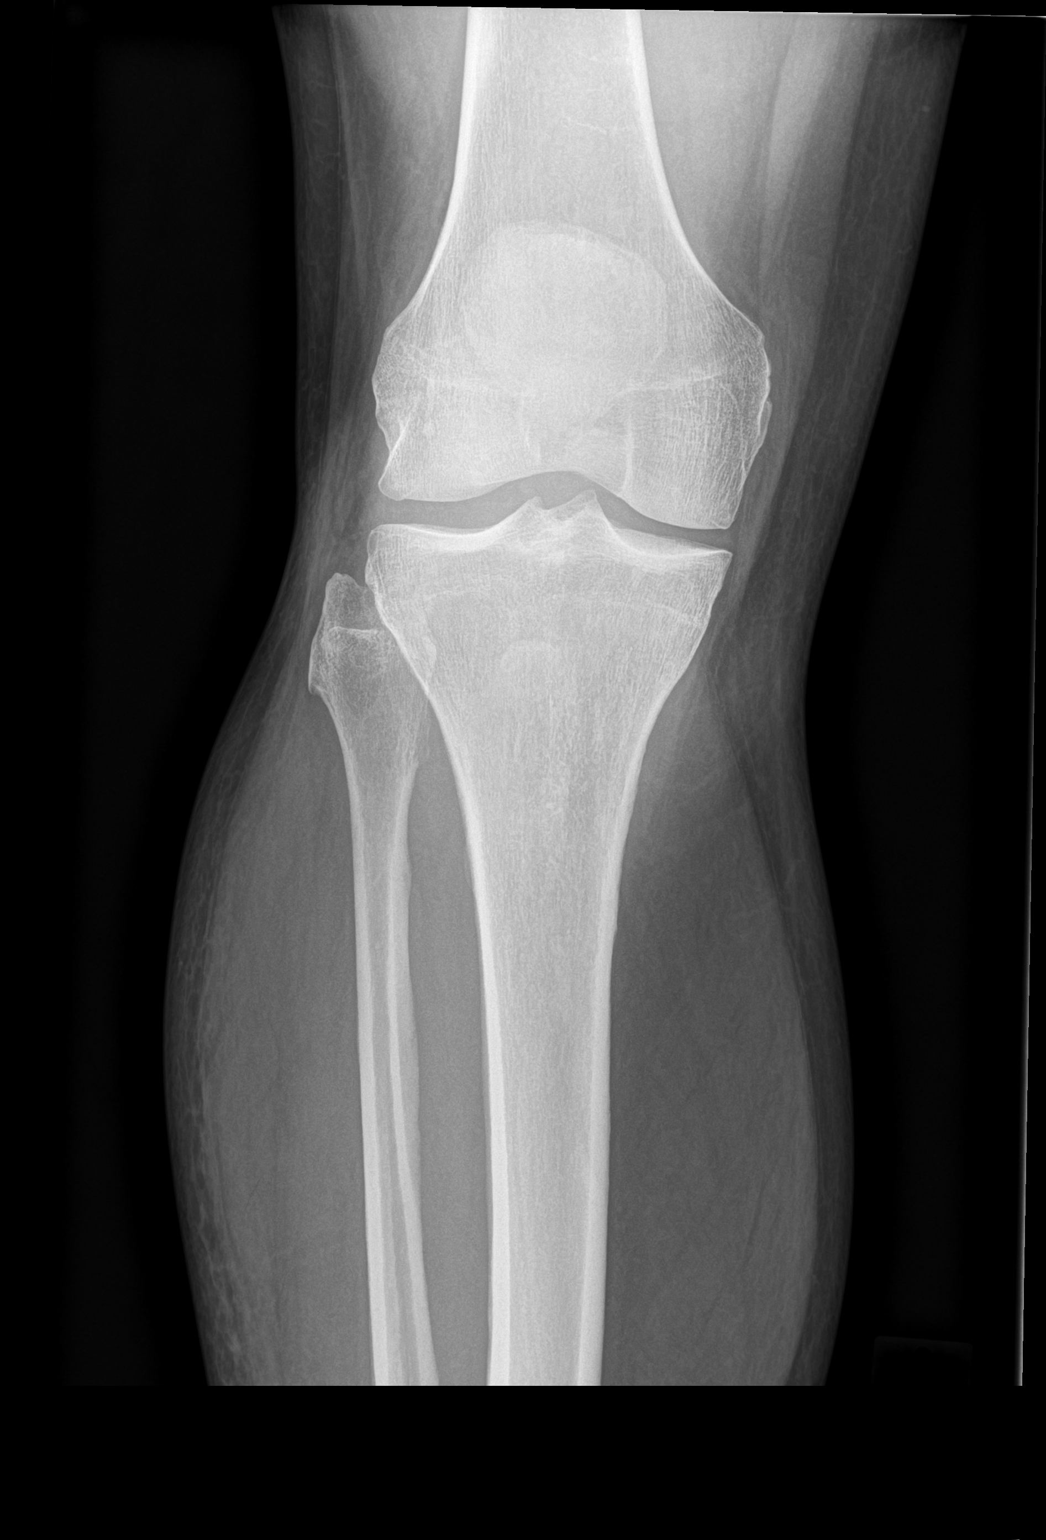
[im 2/4]
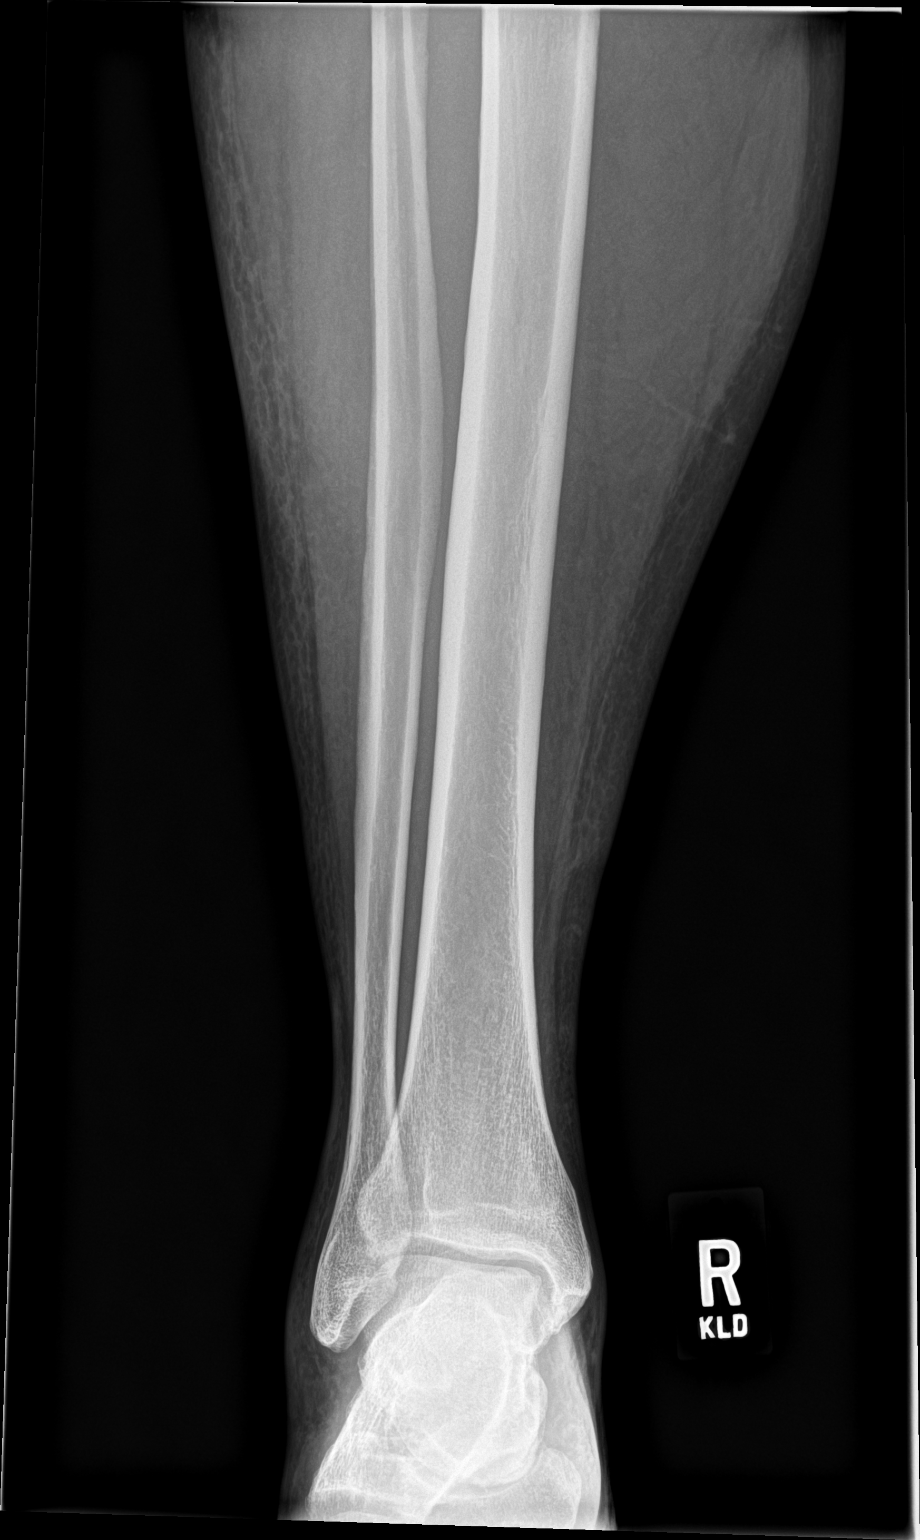
[im 3/4]
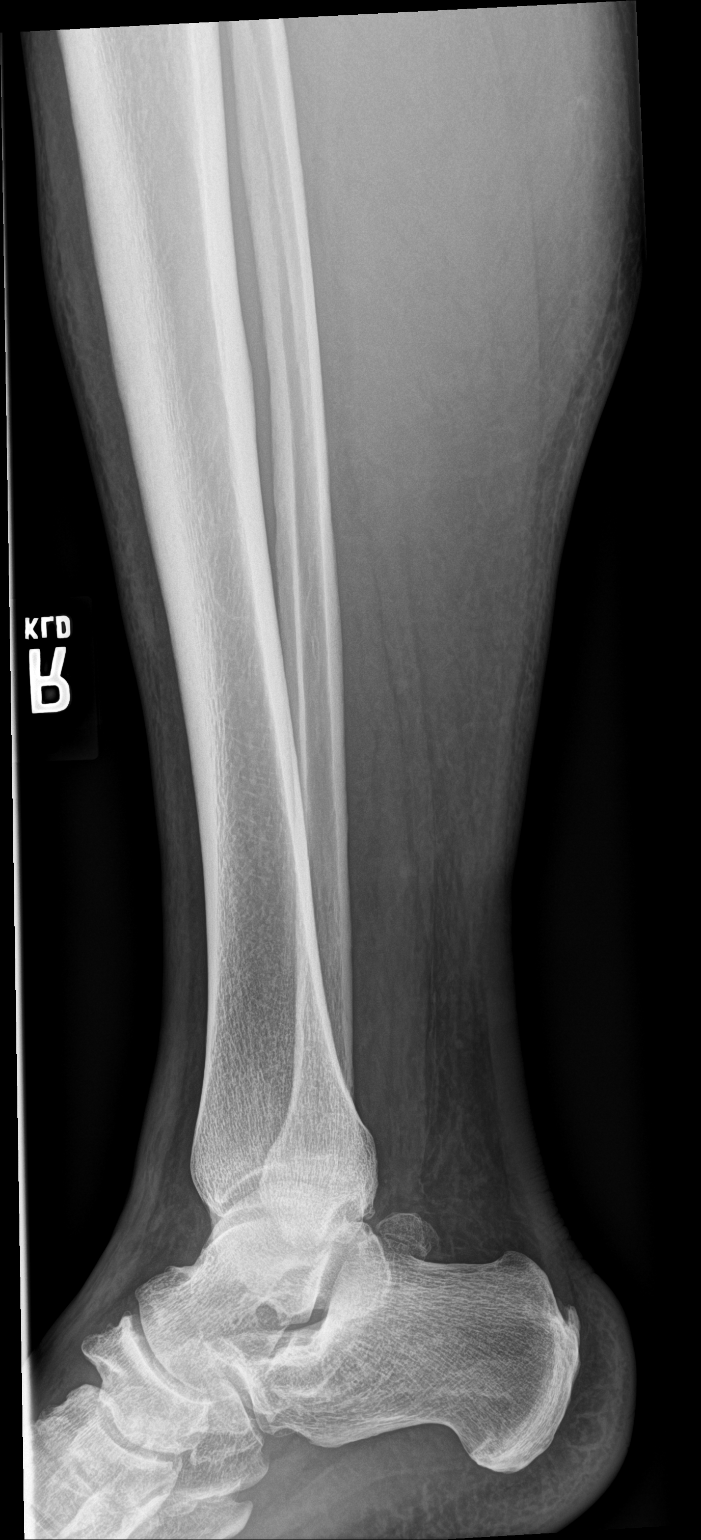
[im 4/4]
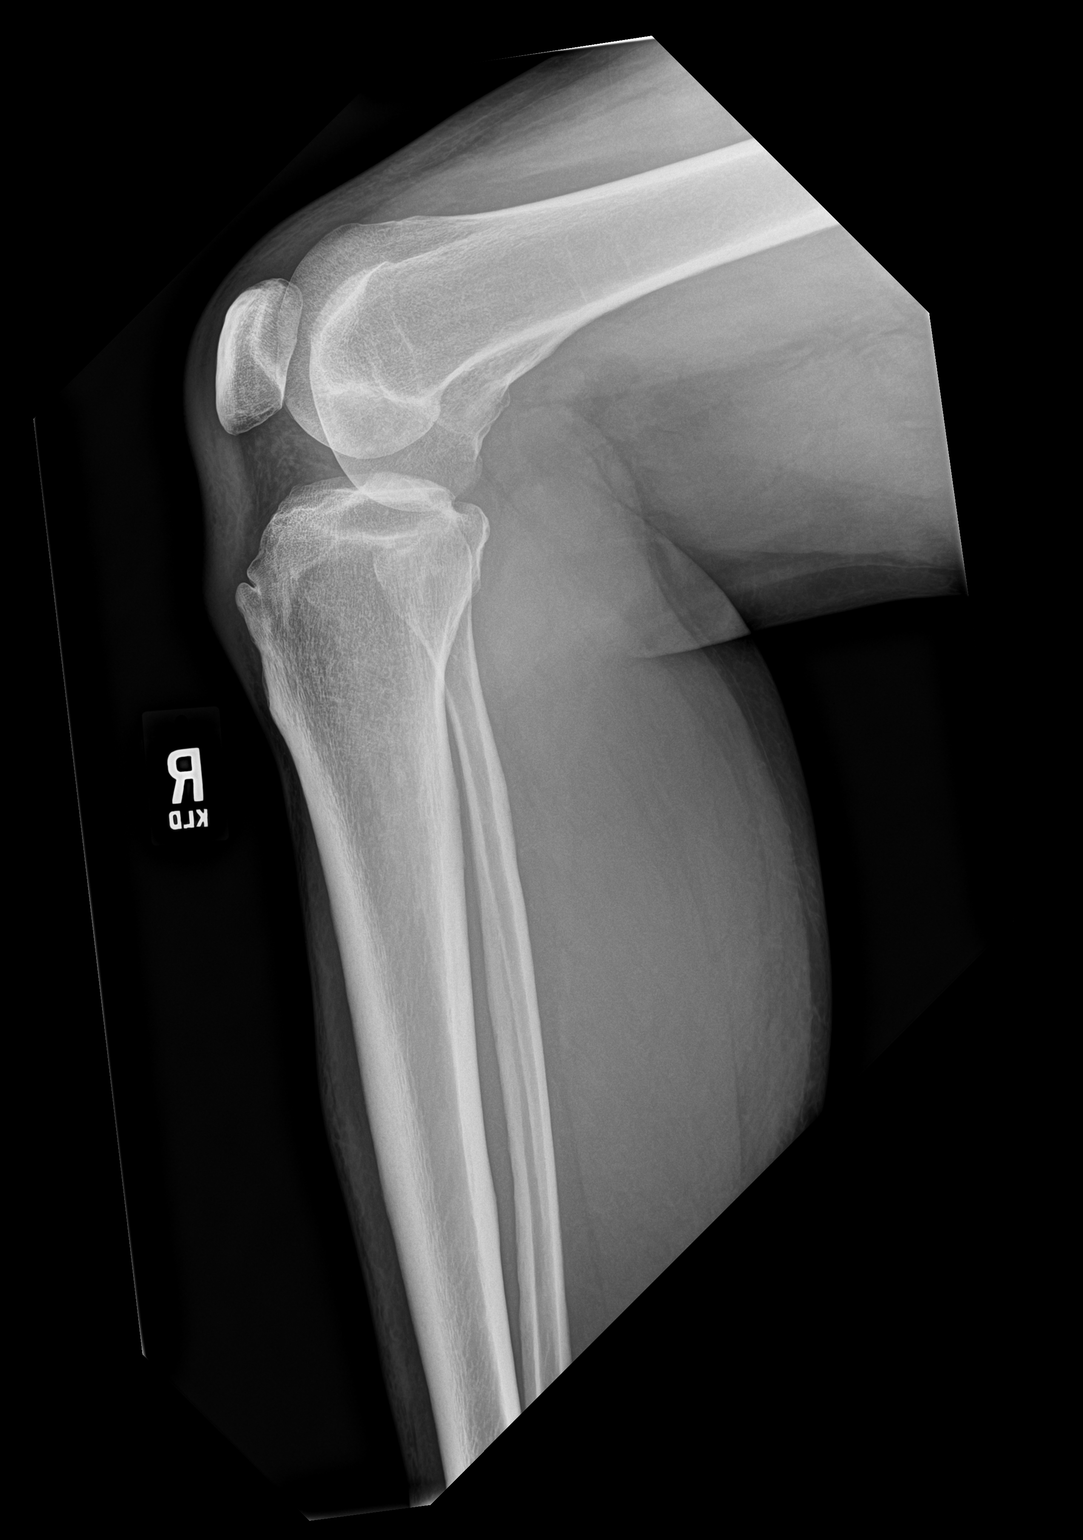

[4 of 4 positions shown; findings below may reference images not displayed]

FINDINGS: No fracture or malalignment. Edema within the subcutaneous soft
tissues. No radiopaque foreign body
IMPRESSION: No acute osseous abnormality

## 2022-05-26 IMAGING — DX DG FOREARM 2V*R*
1 series · 2 of 2 positions shown · non-contrast
Comparison: None.

CLINICAL DATA: Fell off ladder

EXAM:
RIGHT FOREARM - 2 VIEW

[Series 1: forearmbone · 0.14mm/px · 2 of 2 slices shown]
[im 1/2]
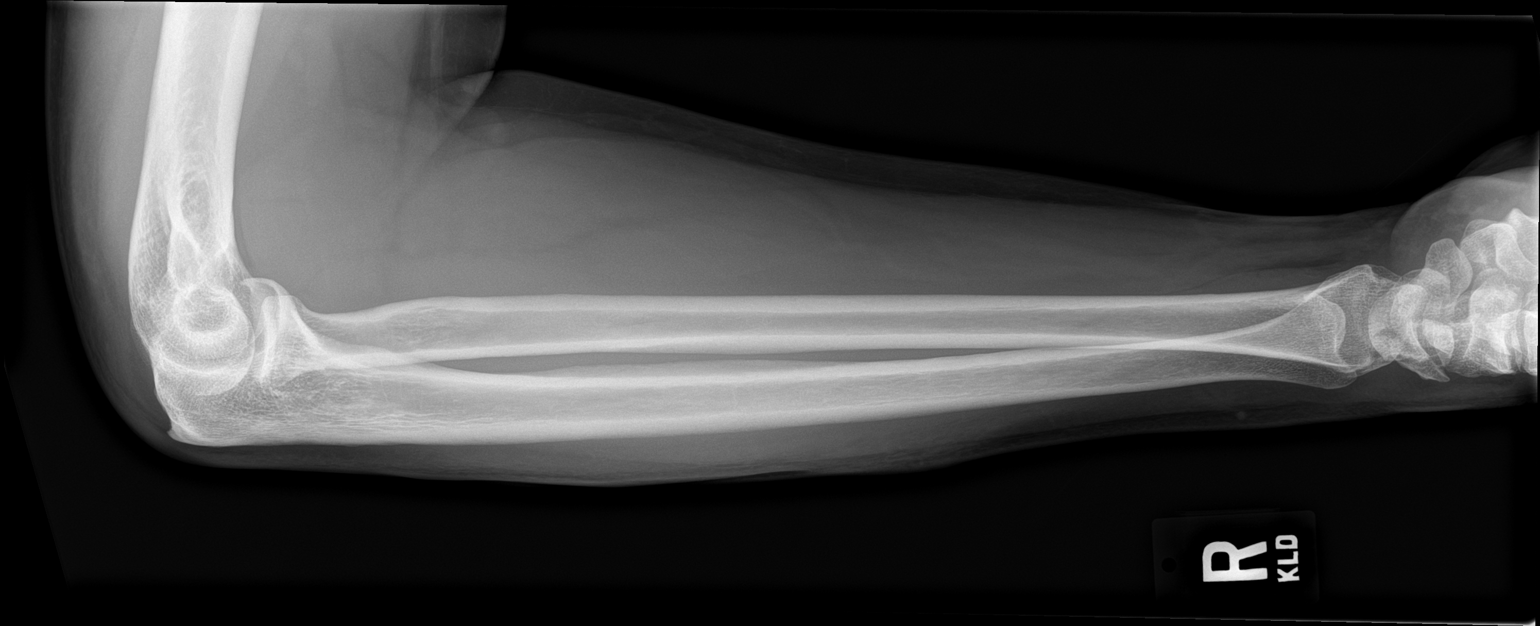
[im 2/2]
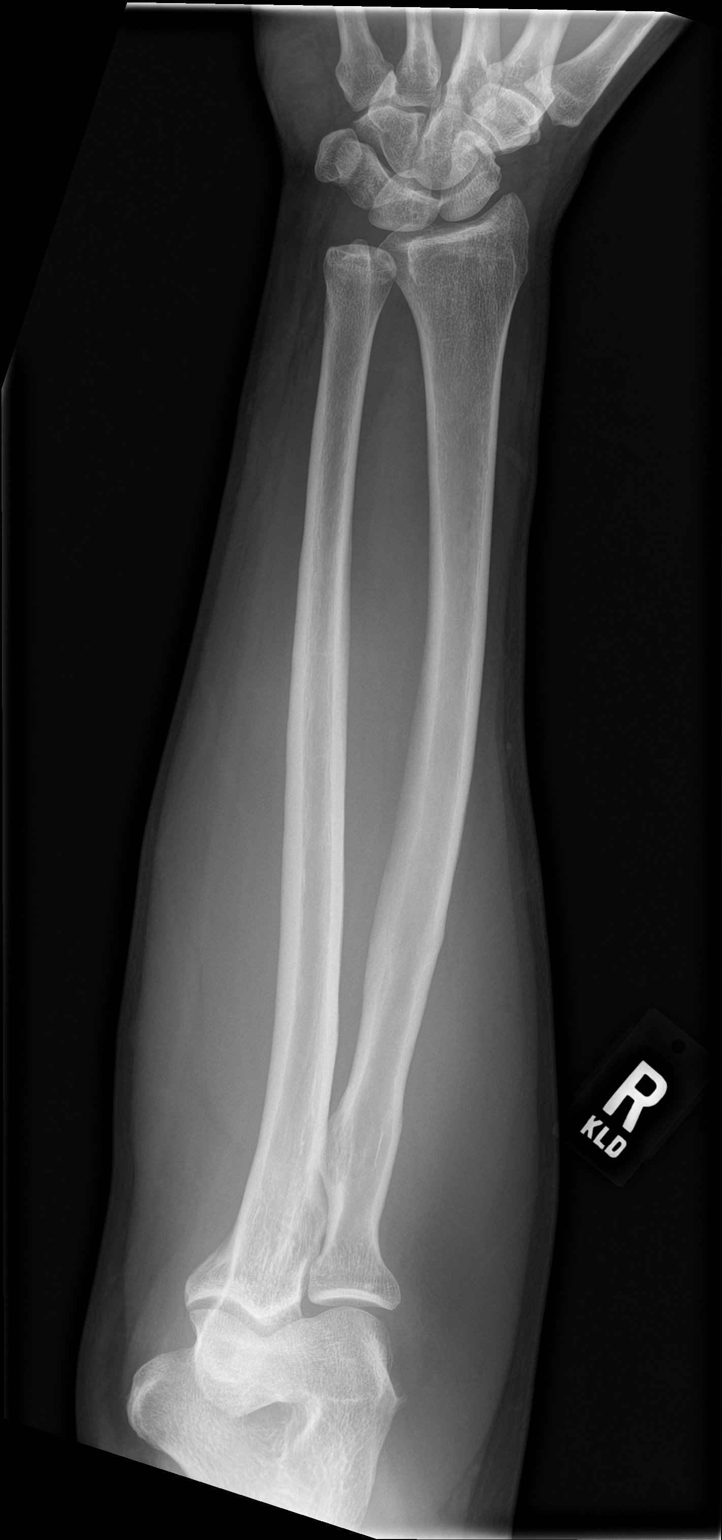

[2 of 2 positions shown; findings below may reference images not displayed]

FINDINGS: There is no evidence of fracture or other focal bone lesions. Soft
tissues are unremarkable.
IMPRESSION: Negative.

## 2022-06-09 ENCOUNTER — Ambulatory Visit: Payer: Commercial Managed Care - PPO | Admitting: Sports Medicine

## 2022-10-17 ENCOUNTER — Ambulatory Visit (HOSPITAL_BASED_OUTPATIENT_CLINIC_OR_DEPARTMENT_OTHER): Admission: RE | Admit: 2022-10-17 | Payer: Commercial Managed Care - PPO | Source: Ambulatory Visit

## 2022-12-21 ENCOUNTER — Encounter (HOSPITAL_COMMUNITY): Payer: Self-pay | Admitting: Emergency Medicine

## 2022-12-21 ENCOUNTER — Emergency Department (HOSPITAL_COMMUNITY): Payer: Commercial Managed Care - PPO

## 2022-12-21 ENCOUNTER — Other Ambulatory Visit: Payer: Self-pay

## 2022-12-21 ENCOUNTER — Observation Stay (HOSPITAL_COMMUNITY)
Admission: EM | Admit: 2022-12-21 | Discharge: 2022-12-23 | Disposition: A | Discharge status: Home / Self Care | Payer: Commercial Managed Care - PPO | Attending: Internal Medicine | Admitting: Internal Medicine

## 2022-12-21 DIAGNOSIS — M791 Myalgia, unspecified site: Secondary | ICD-10-CM | POA: Diagnosis present

## 2022-12-21 DIAGNOSIS — Z832 Family history of diseases of the blood and blood-forming organs and certain disorders involving the immune mechanism: Secondary | ICD-10-CM

## 2022-12-21 DIAGNOSIS — R002 Palpitations: Secondary | ICD-10-CM | POA: Diagnosis present

## 2022-12-21 DIAGNOSIS — E669 Obesity, unspecified: Secondary | ICD-10-CM | POA: Diagnosis present

## 2022-12-21 DIAGNOSIS — I480 Paroxysmal atrial fibrillation: Secondary | ICD-10-CM | POA: Diagnosis not present

## 2022-12-21 DIAGNOSIS — Z888 Allergy status to other drugs, medicaments and biological substances status: Secondary | ICD-10-CM

## 2022-12-21 DIAGNOSIS — R0602 Shortness of breath: Secondary | ICD-10-CM | POA: Insufficient documentation

## 2022-12-21 DIAGNOSIS — Z6831 Body mass index (BMI) 31.0-31.9, adult: Secondary | ICD-10-CM

## 2022-12-21 DIAGNOSIS — R739 Hyperglycemia, unspecified: Secondary | ICD-10-CM | POA: Diagnosis not present

## 2022-12-21 DIAGNOSIS — Z8241 Family history of sudden cardiac death: Secondary | ICD-10-CM

## 2022-12-21 DIAGNOSIS — Z8249 Family history of ischemic heart disease and other diseases of the circulatory system: Secondary | ICD-10-CM

## 2022-12-21 DIAGNOSIS — R112 Nausea with vomiting, unspecified: Secondary | ICD-10-CM | POA: Insufficient documentation

## 2022-12-21 DIAGNOSIS — R42 Dizziness and giddiness: Secondary | ICD-10-CM | POA: Diagnosis present

## 2022-12-21 DIAGNOSIS — E785 Hyperlipidemia, unspecified: Secondary | ICD-10-CM | POA: Diagnosis present

## 2022-12-21 DIAGNOSIS — R918 Other nonspecific abnormal finding of lung field: Secondary | ICD-10-CM | POA: Diagnosis present

## 2022-12-21 DIAGNOSIS — I4891 Unspecified atrial fibrillation: Secondary | ICD-10-CM | POA: Diagnosis present

## 2022-12-21 LAB — CBC WITH DIFFERENTIAL/PLATELET
Abs Immature Granulocytes: 0.04 10*3/uL (ref 0.00–0.07)
Basophils Absolute: 0.1 10*3/uL (ref 0.0–0.1)
Basophils Relative: 1 %
Eosinophils Absolute: 0.1 10*3/uL (ref 0.0–0.5)
Eosinophils Relative: 1 %
HCT: 43.3 % (ref 39.0–52.0)
Hemoglobin: 15.6 g/dL (ref 13.0–17.0)
Immature Granulocytes: 0 %
Lymphocytes Relative: 11 %
Lymphs Abs: 1.1 10*3/uL (ref 0.7–4.0)
MCH: 32.7 pg (ref 26.0–34.0)
MCHC: 36 g/dL (ref 30.0–36.0)
MCV: 90.8 fL (ref 80.0–100.0)
Monocytes Absolute: 0.4 10*3/uL (ref 0.1–1.0)
Monocytes Relative: 4 %
Neutro Abs: 8.4 10*3/uL — ABNORMAL HIGH (ref 1.7–7.7)
Neutrophils Relative %: 83 %
Platelets: 247 10*3/uL (ref 150–400)
RBC: 4.77 MIL/uL (ref 4.22–5.81)
RDW: 12.1 % (ref 11.5–15.5)
WBC: 10.1 10*3/uL (ref 4.0–10.5)
nRBC: 0 % (ref 0.0–0.2)

## 2022-12-21 MED ORDER — SODIUM CHLORIDE 0.9 % IV BOLUS
1000.0000 mL | Freq: Once | INTRAVENOUS | Status: AC
  Administered 2022-12-21: 1000 mL via INTRAVENOUS

## 2022-12-21 MED ORDER — ONDANSETRON HCL 4 MG/2ML IJ SOLN
4.0000 mg | Freq: Once | INTRAMUSCULAR | Status: AC
  Administered 2022-12-21: 4 mg via INTRAVENOUS
  Filled 2022-12-21: qty 2

## 2022-12-21 NOTE — ED Triage Notes (Signed)
Pt BIB EMS from home for new onset a-fib RVR had one drink tonight the started feeling palpitations, ShOB, and dizziness. Pt attempted to drive home when his symptoms worsened. Pt reports vomiting 8 times prior to EMS arrival and once with EMS. Given 4mg  Zofran by EMS.  EMS VS:  141/98 HR 70-140 98% 2LNC CBG 102

## 2022-12-22 ENCOUNTER — Encounter (HOSPITAL_COMMUNITY): Payer: Self-pay | Admitting: Internal Medicine

## 2022-12-22 ENCOUNTER — Observation Stay (HOSPITAL_COMMUNITY): Payer: Commercial Managed Care - PPO

## 2022-12-22 ENCOUNTER — Emergency Department (HOSPITAL_COMMUNITY): Payer: Commercial Managed Care - PPO

## 2022-12-22 DIAGNOSIS — R112 Nausea with vomiting, unspecified: Secondary | ICD-10-CM | POA: Diagnosis present

## 2022-12-22 DIAGNOSIS — Z832 Family history of diseases of the blood and blood-forming organs and certain disorders involving the immune mechanism: Secondary | ICD-10-CM | POA: Diagnosis not present

## 2022-12-22 DIAGNOSIS — R739 Hyperglycemia, unspecified: Secondary | ICD-10-CM | POA: Diagnosis not present

## 2022-12-22 DIAGNOSIS — Z8249 Family history of ischemic heart disease and other diseases of the circulatory system: Secondary | ICD-10-CM | POA: Diagnosis not present

## 2022-12-22 DIAGNOSIS — I480 Paroxysmal atrial fibrillation: Secondary | ICD-10-CM | POA: Diagnosis present

## 2022-12-22 DIAGNOSIS — E669 Obesity, unspecified: Secondary | ICD-10-CM | POA: Diagnosis present

## 2022-12-22 DIAGNOSIS — I4891 Unspecified atrial fibrillation: Secondary | ICD-10-CM | POA: Diagnosis present

## 2022-12-22 DIAGNOSIS — M791 Myalgia, unspecified site: Secondary | ICD-10-CM | POA: Diagnosis present

## 2022-12-22 DIAGNOSIS — R918 Other nonspecific abnormal finding of lung field: Secondary | ICD-10-CM | POA: Diagnosis present

## 2022-12-22 DIAGNOSIS — Z6831 Body mass index (BMI) 31.0-31.9, adult: Secondary | ICD-10-CM | POA: Diagnosis not present

## 2022-12-22 DIAGNOSIS — Z8241 Family history of sudden cardiac death: Secondary | ICD-10-CM | POA: Diagnosis not present

## 2022-12-22 DIAGNOSIS — R002 Palpitations: Secondary | ICD-10-CM | POA: Diagnosis present

## 2022-12-22 DIAGNOSIS — Z888 Allergy status to other drugs, medicaments and biological substances status: Secondary | ICD-10-CM | POA: Diagnosis not present

## 2022-12-22 DIAGNOSIS — E785 Hyperlipidemia, unspecified: Secondary | ICD-10-CM | POA: Diagnosis present

## 2022-12-22 HISTORY — DX: Nausea with vomiting, unspecified: R11.2

## 2022-12-22 LAB — BASIC METABOLIC PANEL
Anion gap: 10 (ref 5–15)
BUN: 9 mg/dL (ref 6–20)
CO2: 21 mmol/L — ABNORMAL LOW (ref 22–32)
Calcium: 8.8 mg/dL — ABNORMAL LOW (ref 8.9–10.3)
Chloride: 106 mmol/L (ref 98–111)
Creatinine, Ser: 0.92 mg/dL (ref 0.61–1.24)
GFR, Estimated: >60 mL/min (ref >60)
Glucose, Bld: 122 mg/dL — ABNORMAL HIGH (ref 70–99)
Potassium: 4.1 mmol/L (ref 3.5–5.1)
Sodium: 137 mmol/L (ref 135–145)

## 2022-12-22 LAB — ECHOCARDIOGRAM COMPLETE
AV Mean grad: 3 mmHg
AV Peak grad: 4.6 mmHg
Ao pk vel: 1.07 m/s
Height: 77 in
S' Lateral: 3.1 cm
Weight: 4312 oz

## 2022-12-22 LAB — T4, FREE: Free T4: 0.9 ng/dL (ref 0.61–1.12)

## 2022-12-22 LAB — TROPONIN I (HIGH SENSITIVITY)
Troponin I (High Sensitivity): 5 ng/L (ref <18)
Troponin I (High Sensitivity): 4 ng/L (ref <18)

## 2022-12-22 LAB — COMPREHENSIVE METABOLIC PANEL
ALT: 40 U/L (ref 0–44)
AST: 30 U/L (ref 15–41)
Albumin: 3.9 g/dL (ref 3.5–5.0)
Alkaline Phosphatase: 50 U/L (ref 38–126)
Anion gap: 14 (ref 5–15)
BUN: 12 mg/dL (ref 6–20)
CO2: 19 mmol/L — ABNORMAL LOW (ref 22–32)
Calcium: 9.3 mg/dL (ref 8.9–10.3)
Chloride: 103 mmol/L (ref 98–111)
Creatinine, Ser: 0.99 mg/dL (ref 0.61–1.24)
GFR, Estimated: >60 mL/min (ref >60)
Glucose, Bld: 122 mg/dL — ABNORMAL HIGH (ref 70–99)
Potassium: 3.6 mmol/L (ref 3.5–5.1)
Sodium: 136 mmol/L (ref 135–145)
Total Bilirubin: 1 mg/dL (ref 0.3–1.2)
Total Protein: 6.8 g/dL (ref 6.5–8.1)

## 2022-12-22 LAB — CBC
HCT: 44.1 % (ref 39.0–52.0)
Hemoglobin: 15.8 g/dL (ref 13.0–17.0)
MCH: 32.5 pg (ref 26.0–34.0)
MCHC: 35.8 g/dL (ref 30.0–36.0)
MCV: 90.7 fL (ref 80.0–100.0)
Platelets: 269 10*3/uL (ref 150–400)
RBC: 4.86 MIL/uL (ref 4.22–5.81)
RDW: 12.3 % (ref 11.5–15.5)
WBC: 8.8 10*3/uL (ref 4.0–10.5)
nRBC: 0 % (ref 0.0–0.2)

## 2022-12-22 LAB — ETHANOL: Alcohol, Ethyl (B): <10 mg/dL (ref <10)

## 2022-12-22 LAB — RAPID URINE DRUG SCREEN, HOSP PERFORMED
Amphetamines: NOT DETECTED
Barbiturates: NOT DETECTED
Benzodiazepines: NOT DETECTED
Cocaine: NOT DETECTED
Opiates: NOT DETECTED
Tetrahydrocannabinol: NOT DETECTED

## 2022-12-22 LAB — LIPID PANEL
Cholesterol: 222 mg/dL — ABNORMAL HIGH (ref 0–200)
HDL: 38 mg/dL — ABNORMAL LOW (ref >40)
LDL Cholesterol: 160 mg/dL — ABNORMAL HIGH (ref 0–99)
Total CHOL/HDL Ratio: 5.8 RATIO
Triglycerides: 122 mg/dL (ref <150)
VLDL: 24 mg/dL (ref 0–40)

## 2022-12-22 LAB — TSH: TSH: 0.891 u[IU]/mL (ref 0.350–4.500)

## 2022-12-22 LAB — MAGNESIUM: Magnesium: 2.2 mg/dL (ref 1.7–2.4)

## 2022-12-22 LAB — LIPASE, BLOOD: Lipase: 29 U/L (ref 11–51)

## 2022-12-22 LAB — HIV ANTIBODY (ROUTINE TESTING W REFLEX): HIV Screen 4th Generation wRfx: NONREACTIVE

## 2022-12-22 MED ORDER — DILTIAZEM HCL-DEXTROSE 125-5 MG/125ML-% IV SOLN (PREMIX)
5.0000 mg/h | INTRAVENOUS | Status: DC
  Administered 2022-12-22: 5 mg/h via INTRAVENOUS
  Filled 2022-12-22: qty 125

## 2022-12-22 MED ORDER — ONDANSETRON HCL 4 MG/2ML IJ SOLN: 4.0000 mg | Freq: Four times a day (QID) | INTRAMUSCULAR | Status: DC | PRN

## 2022-12-22 MED ORDER — IOHEXOL 350 MG/ML SOLN
75.0000 mL | Freq: Once | INTRAVENOUS | Status: AC | PRN
  Administered 2022-12-22: 75 mL via INTRAVENOUS

## 2022-12-22 MED ORDER — ACETAMINOPHEN 325 MG PO TABS: 650.0000 mg | ORAL_TABLET | ORAL | Status: DC | PRN

## 2022-12-22 MED ORDER — DILTIAZEM HCL 25 MG/5ML IV SOLN: 10.0000 mg | Freq: Once | INTRAVENOUS | Status: DC

## 2022-12-22 MED ORDER — ENOXAPARIN SODIUM 120 MG/0.8ML IJ SOSY
120.0000 mg | PREFILLED_SYRINGE | Freq: Two times a day (BID) | INTRAMUSCULAR | Status: DC
  Administered 2022-12-22 (×2): 120 mg via SUBCUTANEOUS
  Filled 2022-12-22 (×4): qty 0.8

## 2022-12-22 MED ORDER — LACTATED RINGERS IV SOLN: INTRAVENOUS | Status: DC

## 2022-12-22 MED ORDER — ENOXAPARIN SODIUM 40 MG/0.4ML IJ SOSY
40.0000 mg | PREFILLED_SYRINGE | INTRAMUSCULAR | Status: DC
  Administered 2022-12-22: 40 mg via SUBCUTANEOUS
  Filled 2022-12-22: qty 0.4

## 2022-12-22 MED ORDER — ASPIRIN 81 MG PO TBEC
81.0000 mg | DELAYED_RELEASE_TABLET | Freq: Every day | ORAL | Status: DC
  Administered 2022-12-22: 81 mg via ORAL
  Filled 2022-12-22: qty 1

## 2022-12-22 MED ORDER — CARVEDILOL 3.125 MG PO TABS
3.1250 mg | ORAL_TABLET | Freq: Two times a day (BID) | ORAL | Status: DC
  Administered 2022-12-22: 3.125 mg via ORAL
  Filled 2022-12-22: qty 1

## 2022-12-22 MED ORDER — DILTIAZEM LOAD VIA INFUSION
5.0000 mg | Freq: Once | INTRAVENOUS | Status: AC
  Administered 2022-12-22: 5 mg via INTRAVENOUS
  Filled 2022-12-22: qty 5

## 2022-12-22 NOTE — H&P (Signed)
History and Physical    Roy Bennett UJW:[id number] DOB: [birth year] DOA: 12/21/2022  DOS: the patient was seen and examined on 12/22/2022  PCP: Merri Brunette, MD   Patient coming from: Home   Chief Complaint:  Chief Complaint  Patient presents with   Palpitations   Dizziness    HPI:  Patient with medical history including hyperlipidemia, presented with complaints of sudden onset of nausea, vomiting, lightheaded and dizziness.  Patient states that he went to have a beer with one of his coworkers, he states he had 1 beer and as he was driving home he started to feel his heart skipping a beat, became very lightheaded dizzy and had associated nausea and vomiting.  He states he feels short of breath, but denies actual pleuritic chest pain, he has no chest pain itself does not radiate to his back no stabbing or tearing like sensation.  Denies any leg swelling or calf tenderness. He mentions that he just got back from [place] about a week ago, no recent surgeries.  He has no cardiac history, denies excessive alcohol use, illicit drug use, he is not actively being treated for diabetes hypertension, has never had anything like that the past.  He mentions that he has bad cardiac history within his family his brother died at 62 from a cardiac arrest and sister has cardiac ablation.   Prior to this incident he was feeling fine, states he had a slight cough but no associate fevers chills general body aches denies any swelling pain.   ED Course:  Initial presentation to ED patient heart rate found in between 91 TO 115.  Blood pressure 136/92, respiratory rate 30 and O2 sat 99% on 2 L oxygen.  High-sensitivity troponin unremarkable.  EKG showed atrial fibrillation with ventricular rate 116. CMP unremarkable except slightly low bicarb 19. CBC unremarkable. Blood ethanol level less than 10 and UDS negative findings. Chest x-ray did not show any active cardiopulmonary disease. With a history of recent  travel CTA chest ordered and ruled out PE.  ED features she had spoken with on-call cardiology Dr. Virgina Jock recommended to start Cardizem drip and and will evaluate patient soon.  Review of Systems:  Review of Systems  Constitutional:  Negative for chills and fever.  Eyes:  Negative for blurred vision.  Respiratory:  Negative for cough.   Cardiovascular:  Negative for chest pain, palpitations, orthopnea and leg swelling.  Gastrointestinal:  Negative for heartburn, nausea and vomiting.  Neurological:  Negative for dizziness and headaches.  Psychiatric/Behavioral:  The patient is not nervous/anxious.     Past Medical History:  Diagnosis Date   Hyperlipidemia    Nausea & vomiting 12/22/2022    Past Surgical History:  Procedure Laterality Date   BRONCHIAL BRUSHINGS  10/12/2020   Procedure: BRONCHIAL BRUSHINGS;  Surgeon: Leslye Peer, MD;  Location: [facility] ENDOSCOPY;  Service: Pulmonary;;   BRONCHIAL NEEDLE ASPIRATION BIOPSY  10/12/2020   Procedure: BRONCHIAL NEEDLE ASPIRATION BIOPSIES;  Surgeon: Leslye Peer, MD;  Location: [facility] ENDOSCOPY;  Service: Pulmonary;;   BRONCHIAL WASHINGS  10/12/2020   Procedure: BRONCHIAL WASHINGS;  Surgeon: Leslye Peer, MD;  Location: [facility] ENDOSCOPY;  Service: Pulmonary;;   VIDEO BRONCHOSCOPY WITH ENDOBRONCHIAL NAVIGATION Bilateral 10/12/2020   Procedure: VIDEO BRONCHOSCOPY WITH ENDOBRONCHIAL NAVIGATION;  Surgeon: Leslye Peer, MD;  Location: [facility] ENDOSCOPY;  Service: Pulmonary;  Laterality: Bilateral;   VIDEO BRONCHOSCOPY WITH ENDOBRONCHIAL ULTRASOUND Bilateral 10/12/2020   Procedure: VIDEO BRONCHOSCOPY WITH ENDOBRONCHIAL ULTRASOUND;  Surgeon: Leslye Peer, MD;  Location: [facility] ENDOSCOPY;  Service: Pulmonary;  Laterality: Bilateral;     reports that he has never smoked. He has never used smokeless tobacco. He reports that he does not currently use alcohol after a past usage of about 2.0 standard drinks of alcohol per week. He reports that he does not use  drugs.  Allergies  Allergen Reactions   Rosuvastatin Calcium     Other reaction(s): muscle aches    Family History  Problem Relation Age of Onset   Heart disease Mother    Clotting disorder Mother    Cancer Mother     Prior to Admission medications   Medication Sig Start Date End Date Taking? Authorizing Provider  clindamycin (CLINDAGEL) 1 % gel Apply 1 Application topically daily. 02/21/22  Yes [provider]     Physical Exam: Vitals:   12/22/22 0407 12/22/22 0427 12/22/22 0431 12/22/22 0432  BP: 111/74  (!) 122/95   Pulse: 94  92   Resp: 15  15   Temp:   (!) 97.5 F (36.4 C) (!) 97.5 F (36.4 C)  TempSrc:   Oral Oral  SpO2: 95%  97%   Weight:  122.2 kg    Height:  6\' 5"  (1.956 m)      Physical Exam Constitutional:      Appearance: Normal appearance.  HENT:     Head: Normocephalic.     Nose: Nose normal.     Mouth/Throat:     Mouth: Mucous membranes are moist.  Eyes:     Pupils: Pupils are equal, round, and reactive to light.  Cardiovascular:     Rate and Rhythm: Tachycardia present. Rhythm irregular.     Pulses: Normal pulses.  Pulmonary:     Effort: Pulmonary effort is normal.  Abdominal:     General: Bowel sounds are normal.  Musculoskeletal:        General: No swelling.     Cervical back: Neck supple.     Right lower leg: No edema.     Left lower leg: No edema.  Skin:    General: Skin is warm.     Capillary Refill: Capillary refill takes less than 2 seconds.  Neurological:     Mental Status: He is alert and oriented to person, place, and time.  Psychiatric:        Mood and Affect: Mood normal.      Labs on Admission: I have personally reviewed following labs and imaging studies  CBC: Recent Labs  Lab 12/21/22 2303 12/22/22 0452  WBC 10.1 8.8  NEUTROABS 8.4*  --   HGB 15.6 15.8  HCT 43.3 44.1  MCV 90.8 90.7  PLT 247 269   Basic Metabolic Panel: Recent Labs  Lab 12/21/22 2303 12/22/22 0452  NA 136 137  K 3.6 4.1  CL  103 106  CO2 19* 21*  GLUCOSE 122* 122*  BUN 12 9  CREATININE 0.99 0.92  CALCIUM 9.3 8.8*  MG 2.2  --    GFR: Estimated Creatinine Clearance: 131.3 mL/min (by C-G formula based on SCr of 0.92 mg/dL). Liver Function Tests: Recent Labs  Lab 12/21/22 2303  AST 30  ALT 40  ALKPHOS 50  BILITOT 1.0  PROT 6.8  ALBUMIN 3.9   Recent Labs  Lab 12/21/22 2303  LIPASE 29   No results for input(s): "AMMONIA" in the last 168 hours. Coagulation Profile: No results for input(s): "INR", "PROTIME" in the last 168 hours. Cardiac Enzymes: Recent Labs  Lab 12/21/22 2303 12/22/22  0114  TROPONINIHS 5 4   BNP (last 3 results) Recent Labs    03/16/22 1224  BNP 23.1   HbA1C: No results for input(s): "HGBA1C" in the last 72 hours. CBG: No results for input(s): "GLUCAP" in the last 168 hours. Lipid Profile: Recent Labs    12/22/22 0452  CHOL 222*  HDL 38*  LDLCALC 160*  TRIG 122  CHOLHDL 5.8   Thyroid Function Tests: Recent Labs    12/22/22 0452  TSH 0.891  FREET4 0.90   Anemia Panel: No results for input(s): "VITAMINB12", "FOLATE", "FERRITIN", "TIBC", "IRON", "RETICCTPCT" in the last 72 hours. Urine analysis: No results found for: "COLORURINE", "APPEARANCEUR", "LABSPEC", "PHURINE", "GLUCOSEU", "HGBUR", "BILIRUBINUR", "KETONESUR", "PROTEINUR", "UROBILINOGEN", "NITRITE", "LEUKOCYTESUR"  Radiological Exams on Admission: I have personally reviewed images CT Angio Chest PE W and/or Wo Contrast  Result Date: 12/22/2022 CLINICAL DATA:  Chest pain and shortness of breath EXAM: CT ANGIOGRAPHY CHEST WITH CONTRAST TECHNIQUE: Multidetector CT imaging of the chest was performed using the standard protocol during bolus administration of intravenous contrast. Multiplanar CT image reconstructions and MIPs were obtained to evaluate the vascular anatomy. RADIATION DOSE REDUCTION: This exam was performed according to the departmental dose-optimization program which includes automated exposure  control, adjustment of the mA and/or kV according to patient size and/or use of iterative reconstruction technique. CONTRAST:  75 mL Omnipaque 350. COMPARISON:  Chest x-ray from earlier in the same day, CT from 04/07/2021. FINDINGS: Cardiovascular: Thoracic aorta shows no aneurysmal dilatation. The degree of opacification is limited precluding evaluation for dissection. No cardiac enlargement is seen. The pulmonary artery shows a normal branching pattern bilaterally. No filling defect to suggest pulmonary embolism is noted. Mediastinum/Nodes: The esophagus as visualized is within normal limits. Thoracic inlet is unremarkable. No hilar or mediastinal adenopathy is seen. Lungs/Pleura: The lungs are well aerated bilaterally. Stable nodular changes are noted in the right lower lobe on images 64, 66 and 84 of series 6. These are stable in appearance from the prior exam Upper Abdomen: Visualized abdomen shows mild diverticular change of the colon. Musculoskeletal: Degenerative changes of the thoracic spine are noted. No acute rib abnormality is seen. Review of the MIP images confirms the above findings. IMPRESSION: No evidence of pulmonary emboli. Stable nodular changes in the right lower lobe when compared with the prior exam as well as a prior PET-CT from 09/30/2020. Given their stability, no further follow-up is recommended. Mild diverticular change of the colon is noted. Electronically Signed   By: Alcide Clever M.D.   On: 12/22/2022 01:02   DG Chest 2 View  Result Date: 12/22/2022 CLINICAL DATA:  Shortness of breath EXAM: CHEST - 2 VIEW COMPARISON:  03/16/2022 FINDINGS: The heart size and mediastinal contours are within normal limits. Both lungs are clear. The visualized skeletal structures are unremarkable. IMPRESSION: No active cardiopulmonary disease. Electronically Signed   By: Alcide Clever M.D.   On: 12/22/2022 00:46    EKG: My personal interpretation of EKG shows: Atrial fibrillation with follicular rate  116.    Assessment/Plan: Principal Problem:   Paroxysmal atrial fibrillation (HCC) Active Problems:   Nausea & vomiting    Assessment and Plan: Paroxysmal atrial fibrillation -Patient has new onset of atrial fibrillation in the setting of drinking 1 can of beer.  No significant underlying cardiac history.  No previous echocardiogram.  Per chart review patient had an CT cardiac scoring 6/20216 showed coronary calcium score 10 which is unremarkable.  Patient's brother had sudden cardiac death from hypertrophic cardiomyopathy  and sister has history of atrial fibrillation status post ablation.  With this family history keep in mind admitting patient inpatient for further workup. - I have spoken with on-call cardiology Dr. Virgina Jock recommended to continue the Cardizem drip obtain echocardiogram.  If atrial fibrillation is not controlled with Cardizem drip possible electrocardioversion, cardiology reevaluate patient in a.m. -Patient's CHA2DS2-VASc is 2 score 0.  No indication for oral anticoagulation. -Obtaining complete echocardiogram. - Continue Cardizem drip. -Continue monitor blood pressure and heart rate.  Continue telemonitoring. -Continue IV fluid LR 75 cc/h until diet resume -Appreciate cardiology input -Patient with need to follow-up with cardiology and A-fib clinic on discharge.  Nausea vomiting -Patient reported acute onset of nausea and vomiting.  He has total 6 episodes of vomiting en route to the hospital.  Lipase within normal range.  Denies any abdominal pain.  No concern for food poisoning. - Continue to monitor -Continue LR 75 cc/h until diet resumed - As needed Zofran on board   DVT prophylaxis: Lovenox Code Status: Full Code.  Discussed with patient at the bedside. Diet: Currently n.p.o. Family Communication: Patient's family member at the bedside updated. Disposition Plan: Plan to discharge to home in 1 to 2 days. Consults: Cardiology Admission status: Observation,  Step Down Unit  Severity of Illness: The appropriate patient status for this patient is OBSERVATION. Observation status is judged to be reasonable and necessary in order to provide the required intensity of service to ensure the patient's safety. The patient's presenting symptoms, physical exam findings, and initial radiographic and laboratory data in the context of their medical condition is felt to place them at decreased risk for further clinical deterioration. Furthermore, it is anticipated that the patient will be medically stable for discharge from the hospital within 2 midnights of admission.     Tereasa Coop MD [facility]  How to contact the [facility] Attending or Consulting provider 7A - 7P or covering provider during after hours 7P -7A, for this patient?   Check the care team in [facility] and look for a) attending/consulting [facility] provider listed and b) the [facility] team listed Log into [email or web address] and use [place]'s universal password to access. If you do not have the password, please contact the hospital operator. Locate the [facility] provider you are looking for under [facility] and page to a number that you can be directly reached. If you still have difficulty reaching the provider, please page the [facility] (Director on Call) for the Hospitalists listed on amion for assistance.  12/22/2022, 6:04 AM

## 2022-12-22 NOTE — Progress Notes (Signed)
ANTICOAGULATION CONSULT NOTE - Initial Consult  Pharmacy Consult for heparin >> enoxaparin  Indication: atrial fibrillation  Allergies  Allergen Reactions   Rosuvastatin Calcium     Other reaction(s): muscle aches    Patient Measurements: Height: 6\' 5"  (195.6 cm) Weight: 122.2 kg (269 lb 8 oz) IBW/kg (Calculated) : 89.1   Vital Signs: Temp: 98 F (36.7 C) (07/04 0737) Temp Source: Oral (07/04 0737) BP: 108/85 (07/04 0800) Pulse Rate: 74 (07/04 0800)  Labs: Recent Labs    12/21/22 2303 12/22/22 0114 12/22/22 0452  HGB 15.6  --  15.8  HCT 43.3  --  44.1  PLT 247  --  269  CREATININE 0.99  --  0.92  TROPONINIHS 5 4  --     Estimated Creatinine Clearance: 131.3 mL/min (by C-G formula based on SCr of 0.92 mg/dL).   Medical History: Past Medical History:  Diagnosis Date   Hyperlipidemia    Nausea & vomiting 12/22/2022     Assessment: 57 yo M with new onset Afib. No anticoagulation prior to admission. Pharmacy consulted for heparin. After discussion with MD, agree with starting enoxaparin instead of heparin.   Goal of Therapy:   Monitor platelets by anticoagulation protocol: Yes   Plan:  Enoxaparin 120mg  q12hr Monitor for signs/symptoms of bleeding  F/u long term plan  Alphia Moh, PharmD, BCPS, BCCP Clinical Pharmacist  Please check AMION for all [facility] Pharmacy phone numbers After 10:00 PM, call Main Pharmacy [phone]

## 2022-12-22 NOTE — Progress Notes (Signed)
[facility] PROGRESS NOTE   Roy Bennett ZOX:[id number] DOB: [birth year] DOA: 12/21/2022  PCP: Merri Brunette, MD  Brief History/Interval Summary: With no significant past medical history except for hyperlipidemia for which he was tried on statin but stopped taking due to side effects.  He was in his usual state of health when he had sudden onset of nausea vomiting lightheadedness and dizziness.  Heart rate was noted to be elevated when he arrived at the emergency department.  He was noted to be in atrial flutter versus atrial fibrillation.  He was started on Cardizem infusion.  He was hospitalized for further management.  Consultants: Cardiology will be consulted  Procedures: Echocardiogram is pending    Subjective/Interval History: Patient denies any chest pain shortness of breath currently.  This morning he was noted to have pauses on telemetry.  He did feel dizzy lightheaded.  His blood pressure was low.  Nursing staff stopped the Cardizem infusion.  Feels better now.    Assessment/Plan:  New onset atrial fibrillation Patient does not have any previous history of same.  He mentioned that he has significant family history of cardiac disease with history of sudden cardiac death in his brother and his sister having atrial fibrillation. Case was discussed with on-call cardiology overnight and patient was started on Cardizem infusion.  Infusion had to be stopped this morning due to pauses.  He was noted to be in atrial fibrillation on telemetry. Seems to be otherwise hemodynamically stable. Thyroid function tests are normal.  CT angiogram did not show any PE. Will initiate IV heparin.   Wait on cardiology input.  Cardioversion may need to be considered. Monitor electrolytes.  Nausea and vomiting Likely related to rapid atrial fibrillation.  No further recurrence. Monitor for now.  Hyperlipidemia Total cholesterol 222, LDL 160, HDL 38.  Patient with previous history of  statin intolerance. He has tried atorvastatin and rosuvastatin.  Has developed joint pains and muscle aches.  May need to try pravastatin.  Will also see what cardiology says.  May need to be seen at the lipid clinic.  Pulmonary nodules Noted on CT angiogram.  Known history.  He has had evaluation for same.  Obesity Estimated body mass index is 31.96 kg/m as calculated from the following:   Height as of this encounter: 6\' 5"  (1.956 m).   Weight as of this encounter: 122.2 kg.   DVT Prophylaxis: On IV heparin now Code Status: Full code Family Communication: Discussed with patient Disposition Plan: Hopefully return home when improved  Status is: Observation The patient remains OBS appropriate and may or may not d/c before 2 midnights.      Medications: Scheduled:  aspirin EC  81 mg Oral Daily   Continuous:  diltiazem (CARDIZEM) infusion Stopped (12/22/22 8119)   lactated ringers 75 mL/hr at 12/22/22 0625   JYN:[id number], ondansetron (ZOFRAN) IV  Antibiotics: Anti-infectives (From admission, onward)    None       Objective:  Vital Signs  Vitals:   12/22/22 0651 12/22/22 0700 12/22/22 0737 12/22/22 0800  BP: 106/73 108/77 104/83 108/85  Pulse:  73 76 74  Resp:  20 11 12   Temp:   98 F (36.7 C)   TempSrc:   Oral   SpO2:  98% 96%   Weight:      Height:        Intake/Output Summary (Last 24 hours) at 12/22/2022 0954 Last data filed at 12/22/2022 0625 Gross per 24 hour  Intake 1077.61 ml  Output  300 ml  Net 777.61 ml   Filed Weights   12/21/22 2254 12/22/22 0427  Weight: 120.2 kg 122.2 kg    General appearance: Awake alert.  In no distress Resp: Clear to auscultation bilaterally.  Normal effort Cardio: S1-S2 is irregularly irregular GI: Abdomen is soft.  Nontender nondistended.  Bowel sounds are present normal.  No masses organomegaly Extremities: No edema.  Full range of motion of lower extremities. Neurologic: Alert and oriented x3.  No focal  neurological deficits.    Lab Results:  Data Reviewed: I have personally reviewed following labs and reports of the imaging studies  CBC: Recent Labs  Lab 12/21/22 2303 12/22/22 0452  WBC 10.1 8.8  NEUTROABS 8.4*  --   HGB 15.6 15.8  HCT 43.3 44.1  MCV 90.8 90.7  PLT 247 269    Basic Metabolic Panel: Recent Labs  Lab 12/21/22 2303 12/22/22 0452  NA 136 137  K 3.6 4.1  CL 103 106  CO2 19* 21*  GLUCOSE 122* 122*  BUN 12 9  CREATININE 0.99 0.92  CALCIUM 9.3 8.8*  MG 2.2  --     GFR: Estimated Creatinine Clearance: 131.3 mL/min (by C-G formula based on SCr of 0.92 mg/dL).  Liver Function Tests: Recent Labs  Lab 12/21/22 2303  AST 30  ALT 40  ALKPHOS 50  BILITOT 1.0  PROT 6.8  ALBUMIN 3.9    Recent Labs  Lab 12/21/22 2303  LIPASE 29     Lipid Profile: Recent Labs    12/22/22 0452  CHOL 222*  HDL 38*  LDLCALC 160*  TRIG 122  CHOLHDL 5.8    Thyroid Function Tests: Recent Labs    12/22/22 0452  TSH 0.891  FREET4 0.90     Radiology Studies: CT Angio Chest PE W and/or Wo Contrast  Result Date: 12/22/2022 CLINICAL DATA:  Chest pain and shortness of breath EXAM: CT ANGIOGRAPHY CHEST WITH CONTRAST TECHNIQUE: Multidetector CT imaging of the chest was performed using the standard protocol during bolus administration of intravenous contrast. Multiplanar CT image reconstructions and MIPs were obtained to evaluate the vascular anatomy. RADIATION DOSE REDUCTION: This exam was performed according to the departmental dose-optimization program which includes automated exposure control, adjustment of the mA and/or kV according to patient size and/or use of iterative reconstruction technique. CONTRAST:  75 mL Omnipaque 350. COMPARISON:  Chest x-ray from earlier in the same day, CT from 04/07/2021. FINDINGS: Cardiovascular: Thoracic aorta shows no aneurysmal dilatation. The degree of opacification is limited precluding evaluation for dissection. No cardiac  enlargement is seen. The pulmonary artery shows a normal branching pattern bilaterally. No filling defect to suggest pulmonary embolism is noted. Mediastinum/Nodes: The esophagus as visualized is within normal limits. Thoracic inlet is unremarkable. No hilar or mediastinal adenopathy is seen. Lungs/Pleura: The lungs are well aerated bilaterally. Stable nodular changes are noted in the right lower lobe on images 64, 66 and 84 of series 6. These are stable in appearance from the prior exam Upper Abdomen: Visualized abdomen shows mild diverticular change of the colon. Musculoskeletal: Degenerative changes of the thoracic spine are noted. No acute rib abnormality is seen. Review of the MIP images confirms the above findings. IMPRESSION: No evidence of pulmonary emboli. Stable nodular changes in the right lower lobe when compared with the prior exam as well as a prior PET-CT from 09/30/2020. Given their stability, no further follow-up is recommended. Mild diverticular change of the colon is noted. Electronically Signed   By: Alcide Clever  M.D.   On: 12/22/2022 01:02   DG Chest 2 View  Result Date: 12/22/2022 CLINICAL DATA:  Shortness of breath EXAM: CHEST - 2 VIEW COMPARISON:  03/16/2022 FINDINGS: The heart size and mediastinal contours are within normal limits. Both lungs are clear. The visualized skeletal structures are unremarkable. IMPRESSION: No active cardiopulmonary disease. Electronically Signed   By: Alcide Clever M.D.   On: 12/22/2022 00:46       LOS: 0 days   Ailea Rhatigan Rito Ehrlich  [facility] Pager on [email or web address]  12/22/2022, 9:54 AM

## 2022-12-22 NOTE — ED Provider Notes (Signed)
[place] EMERGENCY DEPARTMENT AT [facility] Provider Note   CSN: [id number] Arrival date & time: 12/21/22  2250     History  Chief Complaint  Patient presents with   Palpitations   Dizziness    Roy Bennett is a 57 y.o. male.  HPI   Patient with medical history including hyperlipidemia, presented with complaints of sudden onset of nausea vomiting lightheaded dizziness.  Patient states that he went to have a beer with one of his coworkers, he states he had 1 beer and as he was driving home he started to feel his heart skipping a beat, became very lightheaded dizzy and had associated nausea and vomiting.  He states he feels short of breath, but denies actual pleuritic chest pain, he has no chest pain itself does not radiate to his back no stabbing or tearing like sensation.  Denies any leg swelling or calf tenderness, he has no history of PEs or DVTs currently not on hormone therapy, he mentions that he just got back from [place] about a week ago, no recent surgeries.  He has no cardiac history, denies excessive alcohol use, illicit drug use, he is not actively being treated for diabetes hypertension, has never had anything like that the past.  He mentions that he has bad cardiac history within his family his brother died at 100 from a cardiac arrest, sisters all have atrial fibrillation.  Prior to this incident he was feeling fine, states he had a slight cough but no associate fevers chills general body aches denies any swelling pain.    Home Medications Prior to Admission medications   Medication Sig Start Date End Date Taking? Authorizing Provider  clindamycin (CLINDAGEL) 1 % gel Apply 1 Application topically daily. 02/21/22  Yes [provider]      Allergies    Rosuvastatin calcium    Review of Systems   Review of Systems  Constitutional:  Negative for chills and fever.  Respiratory:  Positive for chest tightness and shortness of breath.   Cardiovascular:   Negative for chest pain.  Gastrointestinal:  Positive for nausea and vomiting. Negative for abdominal pain.  Neurological:  Negative for headaches.    Physical Exam Updated Vital Signs BP 129/87   Pulse 94   Temp 97.9 F (36.6 C) (Oral)   Resp 15   Ht 6\' 5"  (1.956 m)   Wt 120.2 kg   SpO2 98%   BMI 31.42 kg/m  Physical Exam Vitals and nursing note reviewed.  Constitutional:      General: He is not in acute distress.    Appearance: He is not ill-appearing.  HENT:     Head: Normocephalic and atraumatic.     Nose: No congestion.  Eyes:     Conjunctiva/sclera: Conjunctivae normal.  Cardiovascular:     Rate and Rhythm: Tachycardia present. Rhythm irregular.     Pulses: Normal pulses.     Heart sounds: No murmur heard.    No friction rub. No gallop.  Pulmonary:     Effort: No respiratory distress.     Breath sounds: No wheezing, rhonchi or rales.  Abdominal:     Palpations: Abdomen is soft.     Tenderness: There is no abdominal tenderness. There is no right CVA tenderness or left CVA tenderness.  Musculoskeletal:     Comments: There is no unilateral leg swelling no calf tenderness no palpable cords.  Skin:    General: Skin is warm and dry.  Neurological:  Mental Status: He is alert.  Psychiatric:        Mood and Affect: Mood normal.     ED Results / Procedures / Treatments   Labs (all labs ordered are listed, but only abnormal results are displayed) Labs Reviewed  COMPREHENSIVE METABOLIC PANEL - Abnormal; Notable for the following components:      Result Value   CO2 19 (*)    Glucose, Bld 122 (*)    All other components within normal limits  CBC WITH DIFFERENTIAL/PLATELET - Abnormal; Notable for the following components:   Neutro Abs 8.4 (*)    All other components within normal limits  ETHANOL  LIPASE, BLOOD  MAGNESIUM  RAPID URINE DRUG SCREEN, HOSP PERFORMED  TSH  TROPONIN I (HIGH SENSITIVITY)  TROPONIN I (HIGH SENSITIVITY)     EKG None  Radiology CT Angio Chest PE W and/or Wo Contrast  Result Date: 12/22/2022 CLINICAL DATA:  Chest pain and shortness of breath EXAM: CT ANGIOGRAPHY CHEST WITH CONTRAST TECHNIQUE: Multidetector CT imaging of the chest was performed using the standard protocol during bolus administration of intravenous contrast. Multiplanar CT image reconstructions and MIPs were obtained to evaluate the vascular anatomy. RADIATION DOSE REDUCTION: This exam was performed according to the departmental dose-optimization program which includes automated exposure control, adjustment of the mA and/or kV according to patient size and/or use of iterative reconstruction technique. CONTRAST:  75 mL Omnipaque 350. COMPARISON:  Chest x-ray from earlier in the same day, CT from 04/07/2021. FINDINGS: Cardiovascular: Thoracic aorta shows no aneurysmal dilatation. The degree of opacification is limited precluding evaluation for dissection. No cardiac enlargement is seen. The pulmonary artery shows a normal branching pattern bilaterally. No filling defect to suggest pulmonary embolism is noted. Mediastinum/Nodes: The esophagus as visualized is within normal limits. Thoracic inlet is unremarkable. No hilar or mediastinal adenopathy is seen. Lungs/Pleura: The lungs are well aerated bilaterally. Stable nodular changes are noted in the right lower lobe on images 64, 66 and 84 of series 6. These are stable in appearance from the prior exam Upper Abdomen: Visualized abdomen shows mild diverticular change of the colon. Musculoskeletal: Degenerative changes of the thoracic spine are noted. No acute rib abnormality is seen. Review of the MIP images confirms the above findings. IMPRESSION: No evidence of pulmonary emboli. Stable nodular changes in the right lower lobe when compared with the prior exam as well as a prior PET-CT from 09/30/2020. Given their stability, no further follow-up is recommended. Mild diverticular change of the colon is  noted. Electronically Signed   By: Alcide Clever M.D.   On: 12/22/2022 01:02   DG Chest 2 View  Result Date: 12/22/2022 CLINICAL DATA:  Shortness of breath EXAM: CHEST - 2 VIEW COMPARISON:  03/16/2022 FINDINGS: The heart size and mediastinal contours are within normal limits. Both lungs are clear. The visualized skeletal structures are unremarkable. IMPRESSION: No active cardiopulmonary disease. Electronically Signed   By: Alcide Clever M.D.   On: 12/22/2022 00:46    Procedures .Critical Care  Performed by: Carroll Sage, PA-C Authorized by: Carroll Sage, PA-C   Critical care provider statement:    Critical care time (minutes):  30   Critical care time was exclusive of:  Separately billable procedures and treating other patients   Critical care was necessary to treat or prevent imminent or life-threatening deterioration of the following conditions:  Cardiac failure   Critical care was time spent personally by me on the following activities:  Development of treatment plan  with patient or surrogate, discussions with consultants, evaluation of patient's response to treatment, examination of patient, ordering and review of laboratory studies, ordering and review of radiographic studies, ordering and performing treatments and interventions, pulse oximetry, re-evaluation of patient's condition and review of old charts   I assumed direction of critical care for this patient from another provider in my specialty: no     Care discussed with: admitting provider       Medications Ordered in ED Medications  diltiazem (CARDIZEM) 1 mg/mL load via infusion 5 mg (5 mg Intravenous Bolus from Bag 12/22/22 0035)    And  diltiazem (CARDIZEM) 125 mg in dextrose 5% 125 mL (1 mg/mL) infusion (15 mg/hr Intravenous Rate/Dose Change 12/22/22 0302)  sodium chloride 0.9 % bolus 1,000 mL (0 mLs Intravenous Stopped 12/22/22 0200)  ondansetron (ZOFRAN) injection 4 mg (4 mg Intravenous Given 12/21/22 2331)  iohexol  (OMNIPAQUE) 350 MG/ML injection 75 mL (75 mLs Intravenous Contrast Given 12/22/22 0056)    ED Course/ Medical Decision Making/ A&P                             Medical Decision Making Amount and/or Complexity of Data Reviewed Labs: ordered. Radiology: ordered.  Risk Prescription drug management. Decision regarding hospitalization.   This patient presents to the ED for concern of nausea vomiting near syncope, this involves an extensive number of treatment options, and is a complaint that carries with it a high risk of complications and morbidity.  The differential diagnosis includes ACS, PE, dissection, metabolic abnormality    Additional history obtained:  Additional history obtained from wife at bedside External records from outside source obtained and reviewed including PCP notes   Co morbidities that complicate the patient evaluation  Hyperlipidemia  Social Determinants of Health:  N/A    Lab Tests:  I Ordered, and personally interpreted labs.  The pertinent results include: CBC is unremarkable, CMP reveals CO2 of 19, glucose 112, ethanol negative, for troponin is 5, lipase 29 rapid urine drug screen negative   Imaging Studies ordered:  I ordered imaging studies including chest x-ray, CTA of chest I independently visualized and interpreted imaging which showed negative for acute findings I agree with the radiologist interpretation   Cardiac Monitoring:  The patient was maintained on a cardiac monitor.  I personally viewed and interpreted the cardiac monitored which showed an underlying rhythm of: Atrial fibrillation   Medicines ordered and prescription drug management:  I ordered medication including fluids I have reviewed the patients home medicines and have made adjustments as needed  Critical Interventions:  Patient new onset of A-fib extremely symptomatic, will start him on diltiazem with infusion. I have started on diltiazem, heart rate has improved,  he is stable at this time,   Reevaluation:  Presents with atrial fibrillation, he had a benign physical exam, due to his recent flight I am concerned for possible PE, will obtain screening lab workup, started on fluids, antiemetics and reassess  After bolus of fluid still atrial fibrillation heart rate in the 130s, will provide diltiazem drip and bolus.  Reassessed the patient, resting comfortably, he is in agreement with admission at this time.    Consultations Obtained:  I requested consultation with the Dr.Osude cardiology and discussed lab and imaging findings as well as pertinent plan - they recommend: Agrees with medicine admission and they will be available for consultation. Spoke with Dr. Janalyn Shy who will admit the patient.  Test Considered:  N/A   Rule out I have low suspicion for ACS as history is atypical, patient has no cardiac history, EKG was sinus rhythm without signs of ischemia, patient had negative delta troponin.  Low suspicion for PE CTA is negative.  Low suspicion for AAA or aortic dissection as history is atypical, patient has low risk factors.  Low suspicion for systemic infection as patient is nontoxic-appearing, vital signs reassuring, no obvious source infection noted on exam.    Dispostion and problem list  After consideration of the diagnostic results and the patients response to treatment, I feel that the patent would benefit from admission.  Atrial fibrillation-currently on diltiazem drip, likely need to be transition to oral rate control as well as started on anticoag.  Likely benefit from cardiac evaluation.            Final Clinical Impression(s) / ED Diagnoses Final diagnoses:  Atrial fibrillation, unspecified type [facility])    Rx / DC Orders ED Discharge Orders          Ordered    Amb referral to AFIB Clinic        12/22/22 0014              Carroll Sage, PA-C 12/22/22 1610    Wilkie Aye Mayer Masker, MD 12/22/22  [phone]

## 2022-12-22 NOTE — Progress Notes (Signed)
Pt's heart rate dropped to 40's-50's nonsustained. Reduced rate of dilt gtt to 5 ml/hr.

## 2022-12-22 NOTE — Consult Note (Signed)
Referring Physician: Osvaldo Shipper, MD  Roy Bennett is an 57 y.o. male.                       Chief Complaint: New onset atrial fibrillation  HPI: 57 years old white male with PMH of HLD and obesity had nausea, vomiting, dizziness without chest pain. He had recent travel overseas/[place].  His CT chest is negative for pulmonary embolism. His Troponin I levels are normal. His echocardiogram is pending. His heart rate has improved with IV diltiazem drip.  He has strong family history of CAD. He had intolerance to Statin in past with Crestor.  Past Medical History:  Diagnosis Date   Hyperlipidemia    Nausea & vomiting 12/22/2022      Past Surgical History:  Procedure Laterality Date   BRONCHIAL BRUSHINGS  10/12/2020   Procedure: BRONCHIAL BRUSHINGS;  Surgeon: Leslye Peer, MD;  Location: [facility] ENDOSCOPY;  Service: Pulmonary;;   BRONCHIAL NEEDLE ASPIRATION BIOPSY  10/12/2020   Procedure: BRONCHIAL NEEDLE ASPIRATION BIOPSIES;  Surgeon: Leslye Peer, MD;  [facility];  Service: Pulmonary;;   BRONCHIAL WASHINGS  10/12/2020   Procedure: BRONCHIAL WASHINGS;  Surgeon: Leslye Peer, MD;  Location: [facility] ENDOSCOPY;  Service: Pulmonary;;   VIDEO BRONCHOSCOPY WITH ENDOBRONCHIAL NAVIGATION Bilateral 10/12/2020   Procedure: VIDEO BRONCHOSCOPY WITH ENDOBRONCHIAL NAVIGATION;  Surgeon: Leslye Peer, MD;  Location: [facility] ENDOSCOPY;  Service: Pulmonary;  Laterality: Bilateral;   VIDEO BRONCHOSCOPY WITH ENDOBRONCHIAL ULTRASOUND Bilateral 10/12/2020   Procedure: VIDEO BRONCHOSCOPY WITH ENDOBRONCHIAL ULTRASOUND;  Surgeon: Leslye Peer, MD;  Location: [facility] ENDOSCOPY;  Service: Pulmonary;  Laterality: Bilateral;    Family History  Problem Relation Age of Onset   Heart disease Mother    Clotting disorder Mother    Cancer Mother    Social History:  reports that he has never smoked. He has never used smokeless tobacco. He reports that he does not currently use alcohol after a past usage of about 2.0  standard drinks of alcohol per week. He reports that he does not use drugs.  Allergies:  Allergies  Allergen Reactions   Rosuvastatin Calcium     Other reaction(s): muscle aches    Medications Prior to Admission  Medication Sig Dispense Refill   clindamycin (CLINDAGEL) 1 % gel Apply 1 Application topically daily.      Results for orders placed or performed during the hospital encounter of 12/21/22 (from the past 48 hour(s))  Comprehensive metabolic panel     Status: Abnormal   Collection Time: 12/21/22 11:03 PM  Result Value Ref Range   Sodium 136 135 - 145 mmol/L   Potassium 3.6 3.5 - 5.1 mmol/L   Chloride 103 98 - 111 mmol/L   CO2 19 (L) 22 - 32 mmol/L   Glucose, Bld 122 (H) 70 - 99 mg/dL    Comment: Glucose reference range applies only to samples taken after fasting for at least 8 hours.   BUN 12 6 - 20 mg/dL   Creatinine, Ser 1.61 0.61 - 1.24 mg/dL   Calcium 9.3 8.9 - 09.6 mg/dL   Total Protein 6.8 6.5 - 8.1 g/dL   Albumin 3.9 3.5 - 5.0 g/dL   AST 30 15 - 41 U/L   ALT 40 0 - 44 U/L   Alkaline Phosphatase 50 38 - 126 U/L   Total Bilirubin 1.0 0.3 - 1.2 mg/dL   GFR, Estimated >04 >54 mL/min    Comment: (NOTE) Calculated using the CKD-EPI Creatinine  Equation (2021)    Anion gap 14 5 - 15    Comment: Performed at [facility], [address], [place], [place] [address]  CBC with Differential     Status: Abnormal   Collection Time: 12/21/22 11:03 PM  Result Value Ref Range   WBC 10.1 4.0 - 10.5 K/uL   RBC 4.77 4.22 - 5.81 MIL/uL   Hemoglobin 15.6 13.0 - 17.0 g/dL   HCT 60.4 54.0 - 98.1 %   MCV 90.8 80.0 - 100.0 fL   MCH 32.7 26.0 - 34.0 pg   MCHC 36.0 30.0 - 36.0 g/dL   RDW 19.1 47.8 - 29.5 %   Platelets 247 150 - 400 K/uL   nRBC 0.0 0.0 - 0.2 %   Neutrophils Relative % 83 %   Neutro Abs 8.4 (H) 1.7 - 7.7 K/uL   Lymphocytes Relative 11 %   Lymphs Abs 1.1 0.7 - 4.0 K/uL   Monocytes Relative 4 %   Monocytes Absolute 0.4 0.1 - 1.0 K/uL   Eosinophils  Relative 1 %   Eosinophils Absolute 0.1 0.0 - 0.5 K/uL   Basophils Relative 1 %   Basophils Absolute 0.1 0.0 - 0.1 K/uL   Immature Granulocytes 0 %   Abs Immature Granulocytes 0.04 0.00 - 0.07 K/uL    Comment: Performed at [facility], [address] [place], [place] [address]  Ethanol     Status: None   Collection Time: 12/21/22 11:03 PM  Result Value Ref Range   Alcohol, Ethyl (B) <10 <10 mg/dL    Comment: (NOTE) Lowest detectable limit for serum alcohol is 10 mg/dL.  For medical purposes only. Performed at [facility], [address], [place], [place] [address]   Troponin I (High Sensitivity)     Status: None   Collection Time: 12/21/22 11:03 PM  Result Value Ref Range   Troponin I (High Sensitivity) 5 <18 ng/L    Comment: (NOTE) Elevated high sensitivity troponin I (hsTnI) values and significant  changes across serial measurements may suggest ACS but many other  chronic and acute conditions are known to elevate hsTnI results.  Refer to the "Links" section for chest pain algorithms and additional  guidance. Performed at [facility], [address] [place], [place] [address]   Lipase, blood     Status: None   Collection Time: 12/21/22 11:03 PM  Result Value Ref Range   Lipase 29 11 - 51 U/L    Comment: Performed at [facility], [address], [place], [place] [address]  Magnesium     Status: None   Collection Time: 12/21/22 11:03 PM  Result Value Ref Range   Magnesium 2.2 1.7 - 2.4 mg/dL    Comment: Performed at [facility], [address] [place], [place] [address]  Urine rapid drug screen (hosp performed)     Status: None   Collection Time: 12/21/22 11:32 PM  Result Value Ref Range   Opiates NONE DETECTED NONE DETECTED   Cocaine NONE DETECTED NONE DETECTED   Benzodiazepines NONE DETECTED NONE DETECTED   Amphetamines NONE DETECTED NONE DETECTED   Tetrahydrocannabinol NONE DETECTED NONE DETECTED   Barbiturates NONE DETECTED  NONE DETECTED    Comment: (NOTE) DRUG SCREEN FOR MEDICAL PURPOSES ONLY.  IF CONFIRMATION IS NEEDED FOR ANY PURPOSE, NOTIFY LAB WITHIN 5 DAYS.  LOWEST DETECTABLE LIMITS FOR URINE DRUG SCREEN Drug Class  Cutoff (ng/mL) Amphetamine and metabolites    1000 Barbiturate and metabolites    200 Benzodiazepine                 200 Opiates and metabolites        300 Cocaine and metabolites        300 THC                            50 Performed at [facility], [address], [place], [place] [address]   Troponin I (High Sensitivity)     Status: None   Collection Time: 12/22/22  1:14 AM  Result Value Ref Range   Troponin I (High Sensitivity) 4 <18 ng/L    Comment: (NOTE) Elevated high sensitivity troponin I (hsTnI) values and significant  changes across serial measurements may suggest ACS but many other  chronic and acute conditions are known to elevate hsTnI results.  Refer to the "Links" section for chest pain algorithms and additional  guidance. Performed at [facility], [address], [place], [place] [address]   TSH     Status: None   Collection Time: 12/22/22  4:52 AM  Result Value Ref Range   TSH 0.891 0.350 - 4.500 uIU/mL    Comment: Performed by a 3rd Generation assay with a functional sensitivity of <=0.01 uIU/mL. Performed at [facility], [address] [place], [place] [address]   HIV Antibody (routine testing w rflx)     Status: None   Collection Time: 12/22/22  4:52 AM  Result Value Ref Range   HIV Screen 4th Generation wRfx Non Reactive Non Reactive    Comment: Performed at [facility], [address] [place], [place] [address]  Basic metabolic panel     Status: Abnormal   Collection Time: 12/22/22  4:52 AM  Result Value Ref Range   Sodium 137 135 - 145 mmol/L   Potassium 4.1 3.5 - 5.1 mmol/L   Chloride 106 98 - 111 mmol/L   CO2 21 (L) 22 - 32 mmol/L   Glucose, Bld 122 (H) 70 - 99 mg/dL    Comment: Glucose  reference range applies only to samples taken after fasting for at least 8 hours.   BUN 9 6 - 20 mg/dL   Creatinine, Ser 6.57 0.61 - 1.24 mg/dL   Calcium 8.8 (L) 8.9 - 10.3 mg/dL   GFR, Estimated >84 >69 mL/min    Comment: (NOTE) Calculated using the CKD-EPI Creatinine Equation (2021)    Anion gap 10 5 - 15    Comment: Performed at [facility], [address] [place], [place] [address]  Lipid panel     Status: Abnormal   Collection Time: 12/22/22  4:52 AM  Result Value Ref Range   Cholesterol 222 (H) 0 - 200 mg/dL   Triglycerides 841 <324 mg/dL   HDL 38 (L) >40 mg/dL   Total CHOL/HDL Ratio 5.8 RATIO   VLDL 24 0 - 40 mg/dL   LDL Cholesterol 102 (H) 0 - 99 mg/dL    Comment:        Total Cholesterol/HDL:CHD Risk Coronary Heart Disease Risk Table                     Men   Women  1/2 Average Risk   3.4   3.3  Average Risk       5.0  4.4  2 X Average Risk   9.6   7.1  3 X Average Risk  23.4   11.0        Use the calculated Patient Ratio above and the CHD Risk Table to determine the patient's CHD Risk.        ATP III CLASSIFICATION (LDL):  <100     mg/dL   Optimal  161-096  mg/dL   Near or Above                    Optimal  130-159  mg/dL   Borderline  045-409  mg/dL   High  >811     mg/dL   Very High Performed at [facility], [address], [place], [place] [address]   CBC     Status: None   Collection Time: 12/22/22  4:52 AM  Result Value Ref Range   WBC 8.8 4.0 - 10.5 K/uL   RBC 4.86 4.22 - 5.81 MIL/uL   Hemoglobin 15.8 13.0 - 17.0 g/dL   HCT 29.5 62.1 - 30.8 %   MCV 90.7 80.0 - 100.0 fL   MCH 32.5 26.0 - 34.0 pg   MCHC 35.8 30.0 - 36.0 g/dL   RDW 65.7 84.6 - 96.2 %   Platelets 269 150 - 400 K/uL   nRBC 0.0 0.0 - 0.2 %    Comment: Performed at [facility], [address], [place], [place] [address]  T4, free     Status: None   Collection Time: 12/22/22  4:52 AM  Result Value Ref Range   Free T4 0.90 0.61 - 1.12 ng/dL    Comment:  (NOTE) Biotin ingestion may interfere with free T4 tests. If the results are inconsistent with the TSH level, previous test results, or the clinical presentation, then consider biotin interference. If needed, order repeat testing after stopping biotin. Performed at [facility], [address], [place], [place] [address]    CT Angio Chest PE W and/or Wo Contrast  Result Date: 12/22/2022 CLINICAL DATA:  Chest pain and shortness of breath EXAM: CT ANGIOGRAPHY CHEST WITH CONTRAST TECHNIQUE: Multidetector CT imaging of the chest was performed using the standard protocol during bolus administration of intravenous contrast. Multiplanar CT image reconstructions and MIPs were obtained to evaluate the vascular anatomy. RADIATION DOSE REDUCTION: This exam was performed according to the departmental dose-optimization program which includes automated exposure control, adjustment of the mA and/or kV according to patient size and/or use of iterative reconstruction technique. CONTRAST:  75 mL Omnipaque 350. COMPARISON:  Chest x-ray from earlier in the same day, CT from 04/07/2021. FINDINGS: Cardiovascular: Thoracic aorta shows no aneurysmal dilatation. The degree of opacification is limited precluding evaluation for dissection. No cardiac enlargement is seen. The pulmonary artery shows a normal branching pattern bilaterally. No filling defect to suggest pulmonary embolism is noted. Mediastinum/Nodes: The esophagus as visualized is within normal limits. Thoracic inlet is unremarkable. No hilar or mediastinal adenopathy is seen. Lungs/Pleura: The lungs are well aerated bilaterally. Stable nodular changes are noted in the right lower lobe on images 64, 66 and 84 of series 6. These are stable in appearance from the prior exam Upper Abdomen: Visualized abdomen shows mild diverticular change of the colon. Musculoskeletal: Degenerative changes of the thoracic spine are noted. No acute rib abnormality is seen. Review of  the MIP images confirms the above findings. IMPRESSION: No evidence of pulmonary emboli. Stable nodular changes in the right lower lobe  when compared with the prior exam as well as a prior PET-CT from 09/30/2020. Given their stability, no further follow-up is recommended. Mild diverticular change of the colon is noted. Electronically Signed   By: Alcide Clever M.D.   On: 12/22/2022 01:02   DG Chest 2 View  Result Date: 12/22/2022 CLINICAL DATA:  Shortness of breath EXAM: CHEST - 2 VIEW COMPARISON:  03/16/2022 FINDINGS: The heart size and mediastinal contours are within normal limits. Both lungs are clear. The visualized skeletal structures are unremarkable. IMPRESSION: No active cardiopulmonary disease. Electronically Signed   By: Alcide Clever M.D.   On: 12/22/2022 00:46    Review Of Systems Constitutional: No fever, chills, weight loss or gain. Eyes: No vision change, wears glasses. No discharge or pain. Ears: No hearing loss, No tinnitus. Respiratory: No asthma, COPD, pneumonias. Positive shortness of breath. No hemoptysis. Cardiovascular: No chest pain, positive palpitation, leg edema. Gastrointestinal: Positive nausea, vomiting, diarrhea, constipation. No GI bleed. No hepatitis. Genitourinary: No dysuria, hematuria, kidney stone. No incontinance. Neurological: No headache, stroke, seizures.  Psychiatry: No psych facility admission for anxiety, depression, suicide. No detox. Skin: No rash. Musculoskeletal: No joint pain, fibromyalgia. No neck pain, back pain. Lymphadenopathy: No lymphadenopathy. Hematology: No anemia or easy bruising.   Blood pressure 108/85, pulse 74, temperature 98 F (36.7 C), temperature source Oral, resp. rate 12, height 6\' 5"  (1.956 m), weight 122.2 kg, SpO2 96 %. Body mass index is 31.96 kg/m. General appearance: alert, cooperative, appears stated age and no distress Head: Normocephalic, atraumatic. Eyes: Hazel eyes, pink conjunctiva, corneas clear.  Neck: No  adenopathy, no carotid bruit, no JVD, supple, symmetrical, trachea midline and thyroid not enlarged. Resp: Clear to auscultation bilaterally. Cardio: Irregular rate and rhythm, S1, S2 normal, II/VI systolic murmur, no click, rub or gallop GI: Soft, non-tender; bowel sounds normal; no organomegaly. Extremities: No edema, cyanosis or clubbing. Skin: Warm and dry.  Neurologic: Alert and oriented X 3, normal strength. Normal coordination and gait.  Assessment/Plan Atrial fibrillation, new onset, CHA2DS2VASc score of 1 HLD Obesity Hyperglycemia  Plan: Add Coreg. Add Eliquis post echocardiogram. Discussed TEE with cardioversion.  Time spent: Review of old records, Lab, x-rays, EKG, other cardiac tests, examination, discussion with patient/Doctor/Nurse over 70 minutes.  Ricki Rodriguez, MD  12/22/2022, 10:58 AM

## 2022-12-22 NOTE — Progress Notes (Signed)
@  9147- RN stopped Cardizem drip due to patient having more than 2 sec. pauses on his Tele. Vitals checked. @0812 - Informed Dr. Georgiana Spinner via secure chat, awaiting response @0843 - Dr. Georgiana Spinner came and assesed pt, advise to stop Cardizem for now

## 2022-12-23 ENCOUNTER — Other Ambulatory Visit (HOSPITAL_COMMUNITY): Payer: Self-pay

## 2022-12-23 DIAGNOSIS — I480 Paroxysmal atrial fibrillation: Secondary | ICD-10-CM | POA: Diagnosis not present

## 2022-12-23 LAB — CBC
HCT: 45.3 % (ref 39.0–52.0)
Hemoglobin: 15.9 g/dL (ref 13.0–17.0)
MCH: 32.5 pg (ref 26.0–34.0)
MCHC: 35.1 g/dL (ref 30.0–36.0)
MCV: 92.6 fL (ref 80.0–100.0)
Platelets: 247 10*3/uL (ref 150–400)
RBC: 4.89 MIL/uL (ref 4.22–5.81)
RDW: 12.4 % (ref 11.5–15.5)
WBC: 7.8 10*3/uL (ref 4.0–10.5)
nRBC: 0 % (ref 0.0–0.2)

## 2022-12-23 LAB — BASIC METABOLIC PANEL
Anion gap: 10 (ref 5–15)
BUN: 15 mg/dL (ref 6–20)
CO2: 24 mmol/L (ref 22–32)
Calcium: 8.8 mg/dL — ABNORMAL LOW (ref 8.9–10.3)
Chloride: 104 mmol/L (ref 98–111)
Creatinine, Ser: 1.21 mg/dL (ref 0.61–1.24)
GFR, Estimated: >60 mL/min (ref >60)
Glucose, Bld: 101 mg/dL — ABNORMAL HIGH (ref 70–99)
Potassium: 4.4 mmol/L (ref 3.5–5.1)
Sodium: 138 mmol/L (ref 135–145)

## 2022-12-23 LAB — MAGNESIUM: Magnesium: 2.2 mg/dL (ref 1.7–2.4)

## 2022-12-23 MED ORDER — CARVEDILOL 6.25 MG PO TABS: 6.2500 mg | ORAL_TABLET | Freq: Two times a day (BID) | ORAL | 2 refills | Status: DC

## 2022-12-23 MED ORDER — DILTIAZEM HCL ER COATED BEADS 120 MG PO CP24: 120.0000 mg | ORAL_CAPSULE | Freq: Every day | ORAL | 2 refills | Status: DC

## 2022-12-23 MED ORDER — APIXABAN 5 MG PO TABS
5.0000 mg | ORAL_TABLET | Freq: Two times a day (BID) | ORAL | Status: DC
  Administered 2022-12-23: 5 mg via ORAL
  Filled 2022-12-23: qty 1

## 2022-12-23 MED ORDER — DILTIAZEM HCL ER COATED BEADS 120 MG PO CP24
120.0000 mg | ORAL_CAPSULE | Freq: Every day | ORAL | Status: DC
  Administered 2022-12-23: 120 mg via ORAL
  Filled 2022-12-23: qty 1

## 2022-12-23 MED ORDER — ATORVASTATIN CALCIUM 10 MG PO TABS
20.0000 mg | ORAL_TABLET | Freq: Every day | ORAL | Status: DC
  Filled 2022-12-23: qty 2

## 2022-12-23 MED ORDER — APIXABAN 5 MG PO TABS: 5.0000 mg | ORAL_TABLET | Freq: Two times a day (BID) | ORAL | 2 refills | Status: DC

## 2022-12-23 MED ORDER — CARVEDILOL 6.25 MG PO TABS
6.2500 mg | ORAL_TABLET | Freq: Two times a day (BID) | ORAL | Status: DC
  Administered 2022-12-23: 6.25 mg via ORAL
  Filled 2022-12-23: qty 1

## 2022-12-23 NOTE — Progress Notes (Signed)
Nsg Discharge Note  Admit Date:  12/21/2022 Discharge date: 12/23/2022   Roy Bennett to be D/C'd Home per MD order.  AVS completed.  Copy for chart, and copy for patient signed, and dated. Patient/caregiver able to verbalize understanding.  Discharge Medication: Allergies as of 12/23/2022       Reactions   Rosuvastatin Calcium    Other reaction(s): muscle aches        Medication List     TAKE these medications    apixaban 5 MG Tabs tablet Commonly known as: ELIQUIS Take 1 tablet (5 mg total) by mouth 2 (two) times daily. Notes to patient: Take 12 hours a part.    carvedilol 6.25 MG tablet Commonly known as: COREG Take 1 tablet (6.25 mg total) by mouth 2 (two) times daily with a meal.   clindamycin 1 % gel Commonly known as: CLINDAGEL Apply 1 Application topically daily.   diltiazem 120 MG 24 hr capsule Commonly known as: CARDIZEM CD Take 1 capsule (120 mg total) by mouth daily. Start taking on: December 24, 2022        Discharge Assessment: Vitals:   12/23/22 0526 12/23/22 0810  BP: 130/86 136/81  Pulse: 93 91  Resp: 16 18  Temp: 97.6 F (36.4 C) 98.1 F (36.7 C)  SpO2: 94% 98%   Skin clean, dry and intact without evidence of skin break down, no evidence of skin tears noted. IV catheter discontinued intact. Site without signs and symptoms of complications - no redness or edema noted at insertion site, patient denies c/o pain - only slight tenderness at site.  Dressing with slight pressure applied.  D/c Instructions-Education: Discharge instructions given to patient/family with verbalized understanding. D/c education completed with patient/family including follow up instructions, medication list, d/c activities limitations if indicated, with other d/c instructions as indicated by MD - patient able to verbalize understanding, all questions fully answered. Patient instructed to return to ED, call 911, or call MD for any changes in condition. REFUSED WHEELCHAIR  RIDE.Walked with spouse.D/C home via private auto.  Roy Bennett, Tilford Pillar, RN 12/23/2022 12:12 PM

## 2022-12-23 NOTE — Discharge Instructions (Addendum)
Information on my medicine - ELIQUIS (apixaban)  This medication education was reviewed with me or my healthcare representative as part of my discharge preparation.     Why was Eliquis prescribed for you? Eliquis was prescribed for you to reduce the risk of a blood clot forming that can cause a stroke if you have a medical condition called atrial fibrillation (a type of irregular heartbeat).  What do You need to know about Eliquis ? Take your Eliquis TWICE DAILY - one tablet in the morning and one tablet in the evening with or without food. If you have difficulty swallowing the tablet whole please discuss with your pharmacist how to take the medication safely.  Take Eliquis exactly as prescribed by your doctor and DO NOT stop taking Eliquis without talking to the doctor who prescribed the medication.  Stopping may increase your risk of developing a stroke.  Refill your prescription before you run out.  After discharge, you should have regular check-up appointments with your healthcare provider that is prescribing your Eliquis.  In the future your dose may need to be changed if your kidney function or weight changes by a significant amount or as you get older.  What do you do if you miss a dose? If you miss a dose, take it as soon as you remember on the same day and resume taking twice daily.  Do not take more than one dose of ELIQUIS at the same time to make up a missed dose.  Important Safety Information A possible side effect of Eliquis is bleeding. You should call your healthcare provider right away if you experience any of the following: Bleeding from an injury or your nose that does not stop. Unusual colored urine (red or dark brown) or unusual colored stools (red or black). Unusual bruising for unknown reasons. A serious fall or if you hit your head (even if there is no bleeding).  Some medicines may interact with Eliquis and might increase your risk of bleeding or clotting  while on Eliquis. To help avoid this, consult your healthcare provider or pharmacist prior to using any new prescription or non-prescription medications, including herbals, vitamins, non-steroidal anti-inflammatory drugs (NSAIDs) and supplements.  This website has more information on Eliquis (apixaban): http://www.eliquis.com/eliquis/home =======================================  Atrial Fibrillation    Atrial fibrillation is a type of heartbeat that is irregular or fast. If you have this condition, your heart beats without any order. This makes it hard for your heart to pump blood in a normal way. Atrial fibrillation may come and go, or it may become a long-lasting problem. If this condition is not treated, it can put you at higher risk for stroke, heart failure, and other heart problems.  What are the causes? This condition may be caused by diseases that damage the heart. They include: High blood pressure. Heart failure. Heart valve disease. Heart surgery. Other causes include: Diabetes. Thyroid disease. Being overweight. Kidney disease. Sometimes the cause is not known.  What increases the risk? You are more likely to develop this condition if: You are older. You smoke. You exercise often and very hard. You have a family history of this condition. You are a man. You use drugs. You drink a lot of alcohol. You have lung conditions, such as emphysema, pneumonia, or COPD. You have sleep apnea.  What are the signs or symptoms? Common symptoms of this condition include: A feeling that your heart is beating very fast. Chest pain or discomfort. Feeling short of breath. Suddenly feeling   light-headed or weak. Getting tired easily during activity. Fainting. Sweating. In some cases, there are no symptoms.  How is this treated? Treatment for this condition depends on underlying conditions and how you feel when you have atrial fibrillation. They include: Medicines to: Prevent  blood clots. Treat heart rate or heart rhythm problems. Using devices, such as a pacemaker, to correct heart rhythm problems. Doing surgery to remove the part of the heart that sends bad signals. Closing an area where clots can form in the heart (left atrial appendage). In some cases, your doctor will treat other underlying conditions.  Follow these instructions at home:  Medicines Take over-the-counter and prescription medicines only as told by your doctor. Do not take any new medicines without first talking to your doctor. If you are taking blood thinners: Talk with your doctor before you take any medicines that have aspirin or NSAIDs, such as ibuprofen, in them. Take your medicine exactly as told by your doctor. Take it at the same time each day. Avoid activities that could hurt or bruise you. Follow instructions about how to prevent falls. Wear a bracelet that says you are taking blood thinners. Or, carry a card that lists what medicines you take. Lifestyle         Do not use any products that have nicotine or tobacco in them. These include cigarettes, e-cigarettes, and chewing tobacco. If you need help quitting, ask your doctor. Eat heart-healthy foods. Talk with your doctor about the right eating plan for you. Exercise regularly as told by your doctor. Do not drink alcohol. Lose weight if you are overweight. Do not use drugs, including cannabis.  General instructions If you have a condition that causes breathing to stop for a short period of time (apnea), treat it as told by your doctor. Keep a healthy weight. Do not use diet pills unless your doctor says they are safe for you. Diet pills may make heart problems worse. Keep all follow-up visits as told by your doctor. This is important.  Contact a doctor if: You notice a change in the speed, rhythm, or strength of your heartbeat. You are taking a blood-thinning medicine and you get more bruising. You get tired more easily  when you move or exercise. You have a sudden change in weight.  Get help right away if:    You have pain in your chest or your belly (abdomen). You have trouble breathing. You have side effects of blood thinners, such as blood in your vomit, poop (stool), or pee (urine), or bleeding that cannot stop. You have any signs of a stroke. "BE FAST" is an easy way to remember the main warning signs: B - Balance. Signs are dizziness, sudden trouble walking, or loss of balance. E - Eyes. Signs are trouble seeing or a change in how you see. F - Face. Signs are sudden weakness or loss of feeling in the face, or the face or eyelid drooping on one side. A - Arms. Signs are weakness or loss of feeling in an arm. This happens suddenly and usually on one side of the body. S - Speech. Signs are sudden trouble speaking, slurred speech, or trouble understanding what people say. T - Time. Time to call emergency services. Write down what time symptoms started. You have other signs of a stroke, such as: A sudden, very bad headache with no known cause. Feeling like you may vomit (nausea). Vomiting. A seizure.  These symptoms may be an emergency. Do not wait to   see if the symptoms will go away. Get medical help right away. Call your local emergency services (911 in the [place] Do not drive yourself to the hospital. Summary Atrial fibrillation is a type of heartbeat that is irregular or fast. You are at higher risk of this condition if you smoke, are older, have diabetes, or are overweight. Follow your doctor's instructions about medicines, diet, exercise, and follow-up visits. Get help right away if you have signs or symptoms of a stroke. Get help right away if you cannot catch your breath, or you have chest pain or discomfort. This information is not intended to replace advice given to you by your health care provider. Make sure you discuss any questions you have with your health care provider. Document  Revised: 11/28/2018 Document Reviewed: 11/28/2018 Elsevier Patient Education  2020 [employer] Inc.    

## 2022-12-23 NOTE — TOC Transition Note (Signed)
Transition of Care [facility]) - CM/SW Discharge Note   Patient Details  Name: Roy Bennett MRN: [id number] Date of Birth: Apr 01, 1966  [facility] CM/SW Contact:  Kermit Balo, RN Phone Number: 12/23/2022, 11:14 AM   Clinical Narrative:    Pt is discharging home with self care. Wife at the bedside and can provide transportation home and supervision at home.  CM provided him with 30 day free and $10 co pay card for Eliquis.    Final next level of care: Home/Self Care Barriers to Discharge: No Barriers Identified   Patient Goals and CMS Choice      Discharge Placement                         Discharge Plan and Services Additional resources added to the After Visit Summary for                                       Social Determinants of Health (SDOH) Interventions SDOH Screenings   Food Insecurity: No Food Insecurity (12/22/2022)  Housing: Low Risk  (12/22/2022)  Transportation Needs: No Transportation Needs (12/22/2022)  Utilities: Not At Risk (12/22/2022)  Tobacco Use: Low Risk  (12/22/2022)     Readmission Risk Interventions     No data to display

## 2022-12-23 NOTE — Discharge Summary (Signed)
[facility]  Physician Discharge Summary   Patient ID: Roy Bennett MRN: [id number] DOB/AGE: April 06, 1966 57 y.o.  Admit date: 12/21/2022 Discharge date: 12/23/2022    PCP: Merri Brunette, MD  DISCHARGE DIAGNOSES:    Paroxysmal atrial fibrillation (HCC)   RECOMMENDATIONS FOR OUTPATIENT FOLLOW UP: Cardiology to arrange outpatient follow-up PCP to consider referral to lipid clinic regarding his hyperlipidemia   Home Health: None Equipment/Devices: None  CODE STATUS: Full code  DISCHARGE CONDITION: fair  Diet recommendation: Heart healthy  INITIAL HISTORY: 57 year old with no significant past medical history except for hyperlipidemia for which he was tried on statin but stopped taking due to side effects.  He was in his usual state of health when he had sudden onset of nausea vomiting lightheadedness and dizziness.  Heart rate was noted to be elevated when he arrived at the emergency department.  He was noted to be in atrial flutter versus atrial fibrillation.  He was started on Cardizem infusion.  He was hospitalized for further management.   Consultants: Cardiology: Dr. Algie Coffer   Procedures: Echocardiogram    HOSPITAL COURSE:   New onset atrial fibrillation Patient does not have any previous history of same.  He mentioned that he has significant family history of cardiac disease with history of sudden cardiac death in his brother and his sister having atrial fibrillation. Patient was started on Cardizem infusion.  He subsequently had pauses so it was discontinued. Cardiology was consulted.  Patient started on carvedilol.  Was also started on therapeutic Lovenox. Converted to sinus rhythm this morning.  Echocardiogram shows EF of 50 to 55%.  CT angiogram was negative for PE.  Thyroid function test was normal. Patient to be discharged on carvedilol and diltiazem per cardiology.  Eliquis also has been initiated.  Cardiology to arrange outpatient follow-up.   Nausea and  vomiting Likely related to rapid atrial fibrillation.  No further recurrence. Monitor for now.   Hyperlipidemia Total cholesterol 222, LDL 160, HDL 38.  Patient with previous history of statin intolerance. He has tried atorvastatin and rosuvastatin.  Has developed joint pains and muscle aches.  Will defer this to outpatient providers.  May benefit from referral to lipid clinic.   Pulmonary nodules Noted on CT angiogram.  Known history.  He has had evaluation for same.  Obesity Estimated body mass index is 31.96 kg/m as calculated from the following:   Height as of this encounter: 6\' 5"  (1.956 m).   Weight as of this encounter: 122.2 kg.  Patient is stable.  Okay for discharge home today.   PERTINENT LABS:  The results of significant diagnostics from this hospitalization (including imaging, microbiology, ancillary and laboratory) are listed below for reference.     Labs:   Basic Metabolic Panel: Recent Labs  Lab 12/21/22 2303 12/22/22 0452 12/23/22 0212  NA 136 137 138  K 3.6 4.1 4.4  CL 103 106 104  CO2 19* 21* 24  GLUCOSE 122* 122* 101*  BUN 12 9 15   CREATININE 0.99 0.92 1.21  CALCIUM 9.3 8.8* 8.8*  MG 2.2  --  2.2   Liver Function Tests: Recent Labs  Lab 12/21/22 2303  AST 30  ALT 40  ALKPHOS 50  BILITOT 1.0  PROT 6.8  ALBUMIN 3.9   Recent Labs  Lab 12/21/22 2303  LIPASE 29    CBC: Recent Labs  Lab 12/21/22 2303 12/22/22 0452 12/23/22 0212  WBC 10.1 8.8 7.8  NEUTROABS 8.4*  --   --   HGB 15.6 15.8  15.9  HCT 43.3 44.1 45.3  MCV 90.8 90.7 92.6  PLT 247 269 247     IMAGING STUDIES ECHOCARDIOGRAM COMPLETE  Result Date: 12/22/2022    ECHOCARDIOGRAM REPORT   Patient Name:   Roy Bennett Date of Exam: 12/22/2022 Medical Rec #:  [id number]      Height:       77.0 in Accession #:    [id number]     Weight:       269.5 lb Date of Birth:  [birth year]     BSA:          2.539 m Patient Age:    56 years       BP:           104/83 mmHg Patient Gender: M               HR:           88 bpm. Exam Location:  Inpatient Procedure: 2D Echo, Color Doppler and Cardiac Doppler Indications:     Afib  History:         Patient has no prior history of Echocardiogram examinations.                  Arrythmias:Atrial Fibrillation.  Sonographer:     Milbert Coulter Referring Phys:  [id number] SUBRINA SUNDIL Diagnosing Phys: Orpah Cobb MD IMPRESSIONS  1. Left ventricular ejection fraction, by estimation, is 50 to 55%. The left ventricle has low normal function. The left ventricle has no regional wall motion abnormalities. Left ventricular diastolic parameters are indeterminate.  2. Right ventricular systolic function is low normal. The right ventricular size is mildly enlarged.  3. Left atrial size was mildly dilated.  4. Right atrial size was moderately dilated.  5. The mitral valve is normal in structure. Trivial mitral valve regurgitation.  6. The aortic valve is tricuspid. Aortic valve regurgitation is not visualized.  7. The inferior vena cava is normal in size with greater than 50% respiratory variability, suggesting right atrial pressure of 3 mmHg. FINDINGS  Left Ventricle: Left ventricular ejection fraction, by estimation, is 50 to 55%. The left ventricle has low normal function. The left ventricle has no regional wall motion abnormalities. The left ventricular internal cavity size was normal in size. There is borderline concentric left ventricular hypertrophy. Left ventricular diastolic parameters are indeterminate. Right Ventricle: The right ventricular size is mildly enlarged. No increase in right ventricular wall thickness. Right ventricular systolic function is low normal. Left Atrium: Left atrial size was mildly dilated. Right Atrium: Right atrial size was moderately dilated. Pericardium: There is no evidence of pericardial effusion. Mitral Valve: The mitral valve is normal in structure. Trivial mitral valve regurgitation. Tricuspid Valve: The tricuspid valve is normal in  structure. Tricuspid valve regurgitation is trivial. Aortic Valve: The aortic valve is tricuspid. Aortic valve regurgitation is not visualized. Aortic valve mean gradient measures 3.0 mmHg. Aortic valve peak gradient measures 4.6 mmHg. Pulmonic Valve: The pulmonic valve was normal in structure. Pulmonic valve regurgitation is not visualized. Aorta: The aortic root is normal in size and structure. Venous: The inferior vena cava is normal in size with greater than 50% respiratory variability, suggesting right atrial pressure of 3 mmHg. IAS/Shunts: The atrial septum is grossly normal.  LEFT VENTRICLE PLAX 2D LVIDd:         4.30 cm LVIDs:         3.10 cm LV PW:         1.10 cm LV IVS:  1.00 cm  RIGHT VENTRICLE RV Basal diam:  4.30 cm RV Mid diam:    3.80 cm RV S prime:     13.80 cm/s TAPSE (M-mode): 2.3 cm LEFT ATRIUM             Index        RIGHT ATRIUM           Index LA diam:        3.90 cm 1.54 cm/m   RA Area:     24.70 cm LA Vol (A2C):   54.3 ml 21.39 ml/m  RA Volume:   84.30 ml  33.20 ml/m LA Vol (A4C):   50.1 ml 19.73 ml/m LA Biplane Vol: 52.7 ml 20.76 ml/m  AORTIC VALVE AV Vmax:           107.00 cm/s AV Vmean:          72.700 cm/s AV VTI:            0.183 m AV Peak Grad:      4.6 mmHg AV Mean Grad:      3.0 mmHg LVOT Vmax:         108.00 cm/s LVOT Vmean:        70.700 cm/s LVOT VTI:          0.157 m LVOT/AV VTI ratio: 0.86  AORTA Ao Root diam: 3.50 cm  SHUNTS Systemic VTI: 0.16 m Orpah Cobb MD Electronically signed by Orpah Cobb MD Signature Date/Time: 12/22/2022/4:08:50 PM    Final    CT Angio Chest PE W and/or Wo Contrast  Result Date: 12/22/2022 CLINICAL DATA:  Chest pain and shortness of breath EXAM: CT ANGIOGRAPHY CHEST WITH CONTRAST TECHNIQUE: Multidetector CT imaging of the chest was performed using the standard protocol during bolus administration of intravenous contrast. Multiplanar CT image reconstructions and MIPs were obtained to evaluate the vascular anatomy. RADIATION DOSE  REDUCTION: This exam was performed according to the departmental dose-optimization program which includes automated exposure control, adjustment of the mA and/or kV according to patient size and/or use of iterative reconstruction technique. CONTRAST:  75 mL Omnipaque 350. COMPARISON:  Chest x-ray from earlier in the same day, CT from 04/07/2021. FINDINGS: Cardiovascular: Thoracic aorta shows no aneurysmal dilatation. The degree of opacification is limited precluding evaluation for dissection. No cardiac enlargement is seen. The pulmonary artery shows a normal branching pattern bilaterally. No filling defect to suggest pulmonary embolism is noted. Mediastinum/Nodes: The esophagus as visualized is within normal limits. Thoracic inlet is unremarkable. No hilar or mediastinal adenopathy is seen. Lungs/Pleura: The lungs are well aerated bilaterally. Stable nodular changes are noted in the right lower lobe on images 64, 66 and 84 of series 6. These are stable in appearance from the prior exam Upper Abdomen: Visualized abdomen shows mild diverticular change of the colon. Musculoskeletal: Degenerative changes of the thoracic spine are noted. No acute rib abnormality is seen. Review of the MIP images confirms the above findings. IMPRESSION: No evidence of pulmonary emboli. Stable nodular changes in the right lower lobe when compared with the prior exam as well as a prior PET-CT from 09/30/2020. Given their stability, no further follow-up is recommended. Mild diverticular change of the colon is noted. Electronically Signed   By: Alcide Clever M.D.   On: 12/22/2022 01:02   DG Chest 2 View  Result Date: 12/22/2022 CLINICAL DATA:  Shortness of breath EXAM: CHEST - 2 VIEW COMPARISON:  03/16/2022 FINDINGS: The heart size and mediastinal contours are within normal limits.  Both lungs are clear. The visualized skeletal structures are unremarkable. IMPRESSION: No active cardiopulmonary disease. Electronically Signed   By: Alcide Clever M.D.   On: 12/22/2022 00:46    DISCHARGE EXAMINATION: Please see progress note from earlier today  DISPOSITION: Home  Discharge Instructions     Amb referral to AFIB Clinic   Complete by: As directed    Amb referral to AFIB Clinic   Complete by: As directed    Call MD for:  difficulty breathing, headache or visual disturbances   Complete by: As directed    Call MD for:  extreme fatigue   Complete by: As directed    Call MD for:  persistant dizziness or light-headedness   Complete by: As directed    Call MD for:  persistant nausea and vomiting   Complete by: As directed    Call MD for:  severe uncontrolled pain   Complete by: As directed    Call MD for:  temperature >100.4   Complete by: As directed    Diet - low sodium heart healthy   Complete by: As directed    Discharge instructions   Complete by: As directed    Medications as prescribed.  Please seek attention immediately if you notice your heart to be racing again.  Watch for signs of bleeding as discussed.  You were cared for by a hospitalist during your hospital stay. If you have any questions about your discharge medications or the care you received while you were in the hospital after you are discharged, you can call the unit and asked to speak with the hospitalist on call if the hospitalist that took care of you is not available. Once you are discharged, your primary care physician will handle any further medical issues. Please note that NO REFILLS for any discharge medications will be authorized once you are discharged, as it is imperative that you return to your primary care physician (or establish a relationship with a primary care physician if you do not have one) for your aftercare needs so that they can reassess your need for medications and monitor your lab values. If you do not have a primary care physician, you can call [phone] for a physician referral.   Increase activity slowly   Complete by: As directed           Allergies as of 12/23/2022       Reactions   Rosuvastatin Calcium    Other reaction(s): muscle aches        Medication List     TAKE these medications    apixaban 5 MG Tabs tablet Commonly known as: ELIQUIS Take 1 tablet (5 mg total) by mouth 2 (two) times daily. Notes to patient: Take 12 hours a part.    carvedilol 6.25 MG tablet Commonly known as: COREG Take 1 tablet (6.25 mg total) by mouth 2 (two) times daily with a meal.   clindamycin 1 % gel Commonly known as: CLINDAGEL Apply 1 Application topically daily.   diltiazem 120 MG 24 hr capsule Commonly known as: CARDIZEM CD Take 1 capsule (120 mg total) by mouth daily.          Follow-up Information     Orpah Cobb, MD Follow up in 1 week(s).   Specialty: Cardiology Contact information: [address] [place] [address] [phone]         Merri Brunette, MD Follow up.   Specialty: Internal Medicine Why: For further management of hyperlipidemia.  Consider  referral to lipid clinic Contact information: [address] [place] [address] [phone]                 TOTAL DISCHARGE TIME: 35 minutes  Brittny Spangle Rito Ehrlich  [facility] Pager on [email or web address]  12/24/2022, 11:29 AM

## 2022-12-23 NOTE — Consult Note (Signed)
Ref: Merri Brunette, MD   Subjective:  Converted to sinus rhythm. Increase Coreg to 6.25 mg. And add diltiazem for rate control. Add Atorvastatin 20 mg. with CO-Q-10 for myalgia if tolerated. Echocardiogram shows mild RV and minimal LV systolic dysfunction.  Objective:  Vital Signs in the last 24 hours: Temp:  [97.6 F (36.4 C)-98.7 F (37.1 C)] 98.1 F (36.7 C) (07/05 0810) Pulse Rate:  [63-115] 91 (07/05 0810) Cardiac Rhythm: Normal sinus rhythm (07/05 0842) Resp:  [12-18] 18 (07/05 0810) BP: (115-137)/(76-88) 136/81 (07/05 0810) SpO2:  [94 %-98 %] 98 % (07/05 0810)  Physical Exam: BP Readings from Last 1 Encounters:  12/23/22 136/81     Wt Readings from Last 1 Encounters:  12/22/22 122.2 kg    Weight change:  Body mass index is 31.96 kg/m. HEENT: [place]/AT, Eyes-Hazel, Conjunctiva-Pink, Sclera-Non-icteric Neck: No JVD, No bruit, Trachea midline. Lungs:  Clear, Bilateral. Cardiac:  Regular rhythm, normal S1 and S2, no S3. II/VI systolic murmur. Abdomen:  Soft, non-tender. BS present. Extremities:  No edema present. No cyanosis. No clubbing. CNS: AxOx3, Cranial nerves grossly intact, moves all 4 extremities.  Skin: Warm and dry.   Intake/Output from previous day: 07/04 0701 - 07/05 0700 In: 1868.2 [P.O.:360; I.V.:1508.2] Out: 300 [Urine:300]    Lab Results: BMET    Component Value Date/Time   NA 138 12/23/2022 0212   NA 137 12/22/2022 0452   NA 136 12/21/2022 2303   K 4.4 12/23/2022 0212   K 4.1 12/22/2022 0452   K 3.6 12/21/2022 2303   CL 104 12/23/2022 0212   CL 106 12/22/2022 0452   CL 103 12/21/2022 2303   CO2 24 12/23/2022 0212   CO2 21 (L) 12/22/2022 0452   CO2 19 (L) 12/21/2022 2303   GLUCOSE 101 (H) 12/23/2022 0212   GLUCOSE 122 (H) 12/22/2022 0452   GLUCOSE 122 (H) 12/21/2022 2303   BUN 15 12/23/2022 0212   BUN 9 12/22/2022 0452   BUN 12 12/21/2022 2303   CREATININE 1.21 12/23/2022 0212   CREATININE 0.92 12/22/2022 0452   CREATININE 0.99  12/21/2022 2303   CALCIUM 8.8 (L) 12/23/2022 0212   CALCIUM 8.8 (L) 12/22/2022 0452   CALCIUM 9.3 12/21/2022 2303   GFRNONAA >60 12/23/2022 0212   GFRNONAA >60 12/22/2022 0452   GFRNONAA >60 12/21/2022 2303   CBC    Component Value Date/Time   WBC 7.8 12/23/2022 0212   RBC 4.89 12/23/2022 0212   HGB 15.9 12/23/2022 0212   HCT 45.3 12/23/2022 0212   PLT 247 12/23/2022 0212   MCV 92.6 12/23/2022 0212   MCH 32.5 12/23/2022 0212   MCHC 35.1 12/23/2022 0212   RDW 12.4 12/23/2022 0212   LYMPHSABS 1.1 12/21/2022 2303   MONOABS 0.4 12/21/2022 2303   EOSABS 0.1 12/21/2022 2303   BASOSABS 0.1 12/21/2022 2303   HEPATIC Function Panel Recent Labs    03/16/22 1224 12/21/22 2303  PROT 6.7 6.8  ALBUMIN 4.2 3.9  AST 15 30  ALT 18 40  ALKPHOS 51 50  BILIDIR 0.2  --   IBILI 0.6  --    HEMOGLOBIN A1C No results found for: "MPG" CARDIAC ENZYMES No results found for: "CKTOTAL", "CKMB", "CKMBINDEX", "TROPONINI" BNP No results for input(s): "PROBNP" in the last 8760 hours. TSH Recent Labs    12/22/22 0452  TSH 0.891   CHOLESTEROL Recent Labs    12/22/22 0452  CHOL 222*    Scheduled Meds:  apixaban  5 mg Oral BID   atorvastatin  20 mg Oral Daily   carvedilol  6.25 mg Oral BID WC   diltiazem  120 mg Oral Daily   Continuous Infusions: PRN Meds:.acetaminophen, ondansetron (ZOFRAN) IV  Assessment/Plan: Paroxysmal atrial fibrillation HLD Obesity Hyperglycemia  Plan: Discussed refraining from caffeine, chocolates, alcohol and more than 8 oz fluid at one time. F/U one week.   LOS: 1 day   Time spent including chart review, lab review, examination, discussion with patient/Family/Doctor : 30 min   Orpah Cobb  MD  12/23/2022, 10:14 AM

## 2022-12-23 NOTE — Progress Notes (Addendum)
[facility] PROGRESS NOTE   Roy Bennett ZOX:[id number] DOB: [birth year] DOA: 12/21/2022  PCP: Merri Brunette, MD  Brief History/Interval Summary: With no significant past medical history except for hyperlipidemia for which he was tried on statin but stopped taking due to side effects.  He was in his usual state of health when he had sudden onset of nausea vomiting lightheadedness and dizziness.  Heart rate was noted to be elevated when he arrived at the emergency department.  He was noted to be in atrial flutter versus atrial fibrillation.  He was started on Cardizem infusion.  He was hospitalized for further management.  Consultants: Cardiology: Dr. Algie Coffer  Procedures: Echocardiogram     Subjective/Interval History: Patient mentions that he feels his heart racing whenever he gets up.  From the bed.  Denies any chest pain shortness of breath otherwise.  No nausea or vomiting.     Assessment/Plan:  New onset atrial fibrillation Patient does not have any previous history of same.  He mentioned that he has significant family history of cardiac disease with history of sudden cardiac death in his brother and his sister having atrial fibrillation. Patient was started on Cardizem infusion.  He subsequently had pauses so it was discontinued. Cardiology was consulted.  Patient started on carvedilol.  Was also started on therapeutic Lovenox. Remains in atrial fibrillation this morning.  He is interested in cardioversion.  Cardiology to see him again today. Thyroid function test was normal.  CT angiogram did not show any PE.  Echocardiogram shows a EF of 50 to 55%. ADDENDUM Patient converted to sinus rhythm a short time after this note was originally written.  Medications adjusted by cardiology.  They have cleared him for discharge.  Discussed with patient.  He is okay with going home today.  Nausea and vomiting Likely related to rapid atrial fibrillation.  No further  recurrence. Monitor for now.  Hyperlipidemia Total cholesterol 222, LDL 160, HDL 38.  Patient with previous history of statin intolerance. He has tried atorvastatin and rosuvastatin.  Has developed joint pains and muscle aches.  Will defer this to outpatient providers.  May benefit from referral to lipid clinic.  Pulmonary nodules Noted on CT angiogram.  Known history.  He has had evaluation for same.  Obesity Estimated body mass index is 31.96 kg/m as calculated from the following:   Height as of this encounter: 6\' 5"  (1.956 m).   Weight as of this encounter: 122.2 kg.   DVT Prophylaxis: Full dose Lovenox Code Status: Full code Family Communication: Discussed with patient Disposition Plan: Hopefully return home when improved    Medications: Scheduled:  aspirin EC  81 mg Oral Daily   carvedilol  3.125 mg Oral BID WC   enoxaparin (LOVENOX) injection  120 mg Subcutaneous Q12H   Continuous:   [id number], ondansetron (ZOFRAN) IV  Antibiotics: Anti-infectives (From admission, onward)    None       Objective:  Vital Signs  Vitals:   12/22/22 1801 12/22/22 2019 12/23/22 0526 12/23/22 0810  BP:  137/88 130/86 136/81  Pulse: 94 63 93 91  Resp:  17 16 18   Temp:  98.7 F (37.1 C) 97.6 F (36.4 C) 98.1 F (36.7 C)  TempSrc:  Oral Oral Oral  SpO2:  97% 94% 98%  Weight:      Height:        Intake/Output Summary (Last 24 hours) at 12/23/2022 0929 Last data filed at 12/23/2022 0543 Gross per 24 hour  Intake 1868.22 ml  Output 300 ml  Net 1568.22 ml    Filed Weights   12/21/22 2254 12/22/22 0427  Weight: 120.2 kg 122.2 kg    General appearance: Awake alert.  In no distress Resp: Clear to auscultation bilaterally.  Normal effort Cardio: S1-S2 is irregularly irregular.  Telemetry shows heart rate fluctuating between 90-120. GI: Abdomen is soft.  Nontender nondistended.  Bowel sounds are present normal.  No masses organomegaly Extremities: No edema.  Full  range of motion of lower extremities. Neurologic: Alert and oriented x3.  No focal neurological deficits.     Lab Results:  Data Reviewed: I have personally reviewed following labs and reports of the imaging studies  CBC: Recent Labs  Lab 12/21/22 2303 12/22/22 0452 12/23/22 0212  WBC 10.1 8.8 7.8  NEUTROABS 8.4*  --   --   HGB 15.6 15.8 15.9  HCT 43.3 44.1 45.3  MCV 90.8 90.7 92.6  PLT 247 269 247     Basic Metabolic Panel: Recent Labs  Lab 12/21/22 2303 12/22/22 0452 12/23/22 0212  NA 136 137 138  K 3.6 4.1 4.4  CL 103 106 104  CO2 19* 21* 24  GLUCOSE 122* 122* 101*  BUN 12 9 15   CREATININE 0.99 0.92 1.21  CALCIUM 9.3 8.8* 8.8*  MG 2.2  --  2.2     GFR: Estimated Creatinine Clearance: 99.8 mL/min (by C-G formula based on SCr of 1.21 mg/dL).  Liver Function Tests: Recent Labs  Lab 12/21/22 2303  AST 30  ALT 40  ALKPHOS 50  BILITOT 1.0  PROT 6.8  ALBUMIN 3.9     Recent Labs  Lab 12/21/22 2303  LIPASE 29      Lipid Profile: Recent Labs    12/22/22 0452  CHOL 222*  HDL 38*  LDLCALC 160*  TRIG 122  CHOLHDL 5.8     Thyroid Function Tests: Recent Labs    12/22/22 0452  TSH 0.891  FREET4 0.90      Radiology Studies: ECHOCARDIOGRAM COMPLETE  Result Date: 12/22/2022    ECHOCARDIOGRAM REPORT   Patient Name:   Roy Bennett Date of Exam: 12/22/2022 Medical Rec #:  [id number]      Height:       77.0 in Accession #:    [id number]     Weight:       269.5 lb Date of Birth:  [birth year]     BSA:          2.539 m Patient Age:    56 years       BP:           104/83 mmHg Patient Gender: M              HR:           88 bpm. Exam Location:  Inpatient Procedure: 2D Echo, Color Doppler and Cardiac Doppler Indications:     Afib  History:         Patient has no prior history of Echocardiogram examinations.                  Arrythmias:Atrial Fibrillation.  Sonographer:     Milbert Coulter Referring Phys:  [id number] SUBRINA SUNDIL Diagnosing Phys: Orpah Cobb  MD IMPRESSIONS  1. Left ventricular ejection fraction, by estimation, is 50 to 55%. The left ventricle has low normal function. The left ventricle has no regional wall motion abnormalities. Left ventricular diastolic parameters are indeterminate.  2. Right ventricular systolic function is low normal. The right  ventricular size is mildly enlarged.  3. Left atrial size was mildly dilated.  4. Right atrial size was moderately dilated.  5. The mitral valve is normal in structure. Trivial mitral valve regurgitation.  6. The aortic valve is tricuspid. Aortic valve regurgitation is not visualized.  7. The inferior vena cava is normal in size with greater than 50% respiratory variability, suggesting right atrial pressure of 3 mmHg. FINDINGS  Left Ventricle: Left ventricular ejection fraction, by estimation, is 50 to 55%. The left ventricle has low normal function. The left ventricle has no regional wall motion abnormalities. The left ventricular internal cavity size was normal in size. There is borderline concentric left ventricular hypertrophy. Left ventricular diastolic parameters are indeterminate. Right Ventricle: The right ventricular size is mildly enlarged. No increase in right ventricular wall thickness. Right ventricular systolic function is low normal. Left Atrium: Left atrial size was mildly dilated. Right Atrium: Right atrial size was moderately dilated. Pericardium: There is no evidence of pericardial effusion. Mitral Valve: The mitral valve is normal in structure. Trivial mitral valve regurgitation. Tricuspid Valve: The tricuspid valve is normal in structure. Tricuspid valve regurgitation is trivial. Aortic Valve: The aortic valve is tricuspid. Aortic valve regurgitation is not visualized. Aortic valve mean gradient measures 3.0 mmHg. Aortic valve peak gradient measures 4.6 mmHg. Pulmonic Valve: The pulmonic valve was normal in structure. Pulmonic valve regurgitation is not visualized. Aorta: The aortic root  is normal in size and structure. Venous: The inferior vena cava is normal in size with greater than 50% respiratory variability, suggesting right atrial pressure of 3 mmHg. IAS/Shunts: The atrial septum is grossly normal.  LEFT VENTRICLE PLAX 2D LVIDd:         4.30 cm LVIDs:         3.10 cm LV PW:         1.10 cm LV IVS:        1.00 cm  RIGHT VENTRICLE RV Basal diam:  4.30 cm RV Mid diam:    3.80 cm RV S prime:     13.80 cm/s TAPSE (M-mode): 2.3 cm LEFT ATRIUM             Index        RIGHT ATRIUM           Index LA diam:        3.90 cm 1.54 cm/m   RA Area:     24.70 cm LA Vol (A2C):   54.3 ml 21.39 ml/m  RA Volume:   84.30 ml  33.20 ml/m LA Vol (A4C):   50.1 ml 19.73 ml/m LA Biplane Vol: 52.7 ml 20.76 ml/m  AORTIC VALVE AV Vmax:           107.00 cm/s AV Vmean:          72.700 cm/s AV VTI:            0.183 m AV Peak Grad:      4.6 mmHg AV Mean Grad:      3.0 mmHg LVOT Vmax:         108.00 cm/s LVOT Vmean:        70.700 cm/s LVOT VTI:          0.157 m LVOT/AV VTI ratio: 0.86  AORTA Ao Root diam: 3.50 cm  SHUNTS Systemic VTI: 0.16 m Orpah Cobb MD Electronically signed by Orpah Cobb MD Signature Date/Time: 12/22/2022/4:08:50 PM    Final    CT Angio Chest PE W and/or Wo Contrast  Result  Date: 12/22/2022 CLINICAL DATA:  Chest pain and shortness of breath EXAM: CT ANGIOGRAPHY CHEST WITH CONTRAST TECHNIQUE: Multidetector CT imaging of the chest was performed using the standard protocol during bolus administration of intravenous contrast. Multiplanar CT image reconstructions and MIPs were obtained to evaluate the vascular anatomy. RADIATION DOSE REDUCTION: This exam was performed according to the departmental dose-optimization program which includes automated exposure control, adjustment of the mA and/or kV according to patient size and/or use of iterative reconstruction technique. CONTRAST:  75 mL Omnipaque 350. COMPARISON:  Chest x-ray from earlier in the same day, CT from 04/07/2021. FINDINGS: Cardiovascular:  Thoracic aorta shows no aneurysmal dilatation. The degree of opacification is limited precluding evaluation for dissection. No cardiac enlargement is seen. The pulmonary artery shows a normal branching pattern bilaterally. No filling defect to suggest pulmonary embolism is noted. Mediastinum/Nodes: The esophagus as visualized is within normal limits. Thoracic inlet is unremarkable. No hilar or mediastinal adenopathy is seen. Lungs/Pleura: The lungs are well aerated bilaterally. Stable nodular changes are noted in the right lower lobe on images 64, 66 and 84 of series 6. These are stable in appearance from the prior exam Upper Abdomen: Visualized abdomen shows mild diverticular change of the colon. Musculoskeletal: Degenerative changes of the thoracic spine are noted. No acute rib abnormality is seen. Review of the MIP images confirms the above findings. IMPRESSION: No evidence of pulmonary emboli. Stable nodular changes in the right lower lobe when compared with the prior exam as well as a prior PET-CT from 09/30/2020. Given their stability, no further follow-up is recommended. Mild diverticular change of the colon is noted. Electronically Signed   By: Alcide Clever M.D.   On: 12/22/2022 01:02   DG Chest 2 View  Result Date: 12/22/2022 CLINICAL DATA:  Shortness of breath EXAM: CHEST - 2 VIEW COMPARISON:  03/16/2022 FINDINGS: The heart size and mediastinal contours are within normal limits. Both lungs are clear. The visualized skeletal structures are unremarkable. IMPRESSION: No active cardiopulmonary disease. Electronically Signed   By: Alcide Clever M.D.   On: 12/22/2022 00:46       LOS: 1 day   Osvaldo Shipper  [facility] Pager on [email or web address]  12/23/2022, 9:29 AM

## 2023-01-18 ENCOUNTER — Ambulatory Visit (INDEPENDENT_AMBULATORY_CARE_PROVIDER_SITE_OTHER): Payer: Commercial Managed Care - PPO

## 2023-01-18 ENCOUNTER — Encounter: Payer: Self-pay | Admitting: Cardiovascular Disease

## 2023-01-18 ENCOUNTER — Ambulatory Visit: Payer: Commercial Managed Care - PPO | Attending: Cardiovascular Disease | Admitting: Cardiovascular Disease

## 2023-01-18 VITALS — BP 116/82 | HR 65 | Ht 76.0 in | Wt 269.8 lb

## 2023-01-18 DIAGNOSIS — E785 Hyperlipidemia, unspecified: Secondary | ICD-10-CM | POA: Insufficient documentation

## 2023-01-18 DIAGNOSIS — E782 Mixed hyperlipidemia: Secondary | ICD-10-CM

## 2023-01-18 DIAGNOSIS — I480 Paroxysmal atrial fibrillation: Secondary | ICD-10-CM

## 2023-01-18 MED ORDER — ROSUVASTATIN CALCIUM 5 MG PO TABS
5.0000 mg | ORAL_TABLET | Freq: Every day | ORAL | 3 refills | Status: DC
Start: 2023-01-18 — End: 2023-01-18

## 2023-01-18 MED ORDER — ROSUVASTATIN CALCIUM 5 MG PO TABS: 5.0000 mg | ORAL_TABLET | ORAL | 3 refills | Status: DC

## 2023-01-18 NOTE — Progress Notes (Signed)
01/18/2023 Roy Bennett   05/27/1966  [id number]  Primary Physician Merri Brunette, MD Primary Cardiologist: Runell Gess MD Nicholes Calamity, [place]  HPI:  Roy Bennett is a 57 y.o. moderately overweight married Caucasian male father of 2 with no grandchildren who was referred by Dr. Renne Crigler, his PCP, to be established because of PAF.  He works as a Geologist, engineering.  He does not smoke.  He does have mild hyperlipidemia intolerant to statin therapy.  His father had a myocardial infarction at age 50 and had bypass surgery.  His brother had sudden cardiac death.  6 of his sisters have A-fib.  He is never had a heart attack or stroke.  He walks for work and is otherwise asymptomatic.  He had an episode of PAF on July 4 after going having a beer with a friend.  He is evaluated at [facility].  His rhythm converted with IV diltiazem.  He was seen by the cardiologist on-call, Dr. Algie Coffer, he began him on Eliquis, carvedilol and Cardizem.  2D echo was essentially normal.  Chest CT showed no evidence of pulmonary embolism.  He has had no recurrent episodes.   Current Meds  Medication Sig   apixaban (ELIQUIS) 5 MG TABS tablet Take 1 tablet (5 mg total) by mouth 2 (two) times daily.   carvedilol (COREG) 6.25 MG tablet Take 1 tablet (6.25 mg total) by mouth 2 (two) times daily with a meal.   clindamycin (CLINDAGEL) 1 % gel Apply 1 Application topically daily.   diltiazem (CARDIZEM CD) 120 MG 24 hr capsule Take 1 capsule (120 mg total) by mouth daily.   rosuvastatin (CRESTOR) 5 MG tablet Take 1 tablet (5 mg total) by mouth daily.     Allergies  Allergen Reactions   Rosuvastatin Calcium     Other reaction(s): muscle aches    Social History   Socioeconomic History   Marital status: Married    Spouse name: Not on file   Number of children: Not on file   Years of education: Not on file   Highest education level: Not on file  Occupational History   Not  on file  Tobacco Use   Smoking status: Never   Smokeless tobacco: Never  Vaping Use   Vaping status: Never Used  Substance and Sexual Activity   Alcohol use: Not Currently    Alcohol/week: 2.0 standard drinks of alcohol    Types: 2 Standard drinks or equivalent per week   Drug use: Never   Sexual activity: Not on file  Other Topics Concern   Not on file  Social History Narrative   Not on file   Social Determinants of Health   Financial Resource Strain: Not on file  Food Insecurity: No Food Insecurity (12/22/2022)   Hunger Vital Sign    Worried About Running Out of Food in the Last Year: Never true    Ran Out of Food in the Last Year: Never true  Transportation Needs: No Transportation Needs (12/22/2022)   PRAPARE - Administrator, Civil Service (Medical): No    Lack of Transportation (Non-Medical): No  Physical Activity: Not on file  Stress: Not on file  Social Connections: Unknown (11/02/2021)   Received from [facility]   Social Network    Social Network: Not on file  Intimate Partner Violence: Not At Risk (12/22/2022)   Humiliation, Afraid, Rape, and Kick questionnaire    Fear of Current or  Ex-Partner: No    Emotionally Abused: No    Physically Abused: No    Sexually Abused: No     Review of Systems: General: negative for chills, fever, night sweats or weight changes.  Cardiovascular: negative for chest pain, dyspnea on exertion, edema, orthopnea, palpitations, paroxysmal nocturnal dyspnea or shortness of breath Dermatological: negative for rash Respiratory: negative for cough or wheezing Urologic: negative for hematuria Abdominal: negative for nausea, vomiting, diarrhea, bright red blood per rectum, melena, or hematemesis Neurologic: negative for visual changes, syncope, or dizziness All other systems reviewed and are otherwise negative except as noted above.    Blood pressure 116/82, pulse 65, height 6\' 4"  (1.93 m), weight 269 lb 12.8 oz (122.4 kg),  SpO2 93%.  General appearance: alert and no distress Neck: no adenopathy, no carotid bruit, no JVD, supple, symmetrical, trachea midline, and thyroid not enlarged, symmetric, no tenderness/mass/nodules Lungs: clear to auscultation bilaterally Heart: regular rate and rhythm, S1, S2 normal, no murmur, click, rub or gallop Extremities: extremities normal, atraumatic, no cyanosis or edema Pulses: 2+ and symmetric Skin: Skin color, texture, turgor normal. No rashes or lesions Neurologic: Grossly normal  EKG EKG Interpretation Date/Time:  Wednesday January 18 2023 13:12:38 EDT Ventricular Rate:  65 PR Interval:  168 QRS Duration:  94 QT Interval:  406 QTC Calculation: 422 R Axis:   31  Text Interpretation: Normal sinus rhythm Septal infarct , age undetermined When compared with ECG of 22-Dec-2022 00:32, PREVIOUS ECG IS PRESENT Confirmed by Nanetta Batty [phone]) on 01/18/2023 1:18:22 PM    ASSESSMENT AND PLAN:   Atrial fibrillation (HCC) History of PAF on July 4 of this year after going out with a friend and drinking a beer.  He went to [facility] where he was put on diltiazem drip and converted to sinus rhythm.  He was seen by Dr. Algie Coffer and consultation who obtained a 2D echo that was normal with mild left atrial enlargement.  Placed on Eliquis, carvedilol and Cardizem.  He said no recurrence.  He does drink espresso and eats chocolate.  I am going to get a 2-week Zio patch to see if he has any silent A-fib but given his CHA2DSVASC2 score of 0 I am inclined not to keep him on anticoagulation.  I also intend to discontinue his Cardizem.  We talked about limiting his caffeine intake.  Hyperlipidemia History of hyperlipidemia apparently intolerant to statin therapy with lipid profile performed 12/22/2022 revealing a total cholesterol of 222, LDL of 160 and HDL 38.  He does have a coronary calcium score of 0 on 09/17/2020.  I prefer to get his LDL down closer to 100.  Will start him on  rosuvastatin 5 mg 3 times a week and recheck in 3 months.     Runell Gess MD FACP,FACC,FAHA, [facility] 01/18/2023 1:51 PM

## 2023-01-18 NOTE — Patient Instructions (Addendum)
Medication Instructions:  Your physician has recommended you make the following change in your medication:   -Start rosuvastation (crestor) 5mg  three times a week.  *If you need a refill on your cardiac medications before your next appointment, please call your pharmacy*   Lab Work: Your physician recommends that you return for lab work in: 3 months for FASTING lipid/liver panel  If you have labs (blood work) drawn today and your tests are completely normal, you will receive your results only by: MyChart Message (if you have MyChart) OR A paper copy in the mail If you have any lab test that is abnormal or we need to change your treatment, we will call you to review the results.   Testing/Procedures:  Christena Deem- Long Term Monitor Instructions  Your physician has requested you wear a ZIO patch monitor for 14 days.  This is a single patch monitor. [employer] supplies one patch monitor per enrollment. Additional stickers are not available. Please do not apply patch if you will be having a Nuclear Stress Test,  Echocardiogram, Cardiac CT, MRI, or Chest Xray during the period you would be wearing the  monitor. The patch cannot be worn during these tests. You cannot remove and re-apply the  ZIO XT patch monitor.  Your ZIO patch monitor will be mailed 3 day USPS to your address on file. It may take 3-5 days  to receive your monitor after you have been enrolled.  Once you have received your monitor, please review the enclosed instructions. Your monitor  has already been registered assigning a specific monitor serial # to you.  Billing and Patient Assistance Program Information  We have supplied [employer] with any of your insurance information on file for billing purposes. [employer] offers a sliding scale Patient Assistance Program for patients that do not have  insurance, or whose insurance does not completely cover the cost of the ZIO monitor.  You must apply for the Patient Assistance Program to  qualify for this discounted rate.  To apply, please call [employer] at [phone], select option 4, select option 2, ask to apply for  Patient Assistance Program. Meredeth Ide will ask your household income, and how many people  are in your household. They will quote your out-of-pocket cost based on that information.  [employer] will also be able to set up a 36-month, interest-free payment plan if needed.  Applying the monitor   Shave hair from upper left chest.  Hold abrader disc by orange tab. Rub abrader in 40 strokes over the upper left chest as  indicated in your monitor instructions.  Clean area with 4 enclosed alcohol pads. Let dry.  Apply patch as indicated in monitor instructions. Patch will be placed under collarbone on left  side of chest with arrow pointing upward.  Rub patch adhesive wings for 2 minutes. Remove white label marked "1". Remove the white  label marked "2". Rub patch adhesive wings for 2 additional minutes.  While looking in a mirror, press and release button in center of patch. A small green light will  flash 3-4 times. This will be your only indicator that the monitor has been turned on.  Do not shower for the first 24 hours. You may shower after the first 24 hours.  Press the button if you feel a symptom. You will hear a small click. Record Date, Time and  Symptom in the Patient Logbook.  When you are ready to remove the patch, follow instructions on the last 2 pages of Patient  Logbook. Stick patch monitor onto the last page of Patient Logbook.  Place Patient Logbook in the blue and white box. Use locking tab on box and tape box closed  securely. The blue and white box has prepaid postage on it. Please place it in the mailbox as  soon as possible. Your physician should have your test results approximately 7 days after the  monitor has been mailed back to [facility].  Call [facility] Customer Care at [phone] if you have questions regarding  your ZIO XT  patch monitor. Call them immediately if you see an orange light blinking on your  monitor.  If your monitor falls off in less than 4 days, contact our Monitor department at [phone].  If your monitor becomes loose or falls off after 4 days call [employer] at [phone] for  suggestions on securing your monitor    Follow-Up: At [facility], you and your health needs are our priority.  As part of our continuing mission to provide you with exceptional heart care, we have created designated Provider Care Teams.  These Care Teams include your primary Cardiologist (physician) and Advanced Practice Providers (APPs -  Physician Assistants and Nurse Practitioners) who all work together to provide you with the care you need, when you need it.  We recommend signing up for the patient portal called "MyChart".  Sign up information is provided on this After Visit Summary.  MyChart is used to connect with patients for Virtual Visits (Telemedicine).  Patients are able to view lab/test results, encounter notes, upcoming appointments, etc.  Non-urgent messages can be sent to your provider as well.   To learn more about what you can do with MyChart, go to [email or web address].    Your next appointment:   3 month(s)  Provider:   Nanetta Batty, MD

## 2023-01-18 NOTE — Assessment & Plan Note (Signed)
History of PAF on July 4 of this year after going out with a friend and drinking a beer.  He went to [facility] where he was put on diltiazem drip and converted to sinus rhythm.  He was seen by Dr. Algie Coffer and consultation who obtained a 2D echo that was normal with mild left atrial enlargement.  Placed on Eliquis, carvedilol and Cardizem.  He said no recurrence.  He does drink espresso and eats chocolate.  I am going to get a 2-week Zio patch to see if he has any silent A-fib but given his CHA2DSVASC2 score of 0 I am inclined not to keep him on anticoagulation.  I also intend to discontinue his Cardizem.  We talked about limiting his caffeine intake.

## 2023-01-18 NOTE — Addendum Note (Signed)
Addended by: Bernita Buffy on: 01/18/2023 02:06 PM   Modules accepted: Orders

## 2023-01-18 NOTE — Progress Notes (Unsigned)
Enrolled patient for a 14 day Zio XT  monitor to be mailed to patients home  °

## 2023-01-18 NOTE — Assessment & Plan Note (Signed)
History of hyperlipidemia apparently intolerant to statin therapy with lipid profile performed 12/22/2022 revealing a total cholesterol of 222, LDL of 160 and HDL 38.  He does have a coronary calcium score of 0 on 09/17/2020.  I prefer to get his LDL down closer to 100.  Will start him on rosuvastatin 5 mg 3 times a week and recheck in 3 months.

## 2023-02-05 DIAGNOSIS — I480 Paroxysmal atrial fibrillation: Secondary | ICD-10-CM

## 2023-02-05 DIAGNOSIS — E782 Mixed hyperlipidemia: Secondary | ICD-10-CM

## 2023-02-13 ENCOUNTER — Ambulatory Visit: Payer: Commercial Managed Care - PPO | Admitting: Cardiovascular Disease

## 2023-04-03 ENCOUNTER — Telehealth: Payer: Self-pay | Admitting: Cardiovascular Disease

## 2023-04-03 NOTE — Telephone Encounter (Signed)
Wife Liborio Nixon) stated patient's claim for his hospital stay in July is being denied by his insurance company.  Wife wants a call to insurance company at phone# [phone] to confirm patient's hospital stay was necessary.  Ref# [id number].  Patient's member ID - [id number].  Wife wants a call back to confirm.

## 2023-04-03 NOTE — Telephone Encounter (Signed)
Left voicemail to return call to office

## 2023-04-07 NOTE — Telephone Encounter (Signed)
Spoke with pt wife, aware she will need to contact the hospital billing office at [phone] to help with those issues.

## 2023-04-10 NOTE — Progress Notes (Unsigned)
   Rubin Payor, PhD, LAT, ATC acting as a scribe for Clementeen Graham, MD.  Roy Bennett is a 57 y.o. male who presents to [facility] at [facility] today for LBP. Pt was seen previously by Dr. Denyse Amass in 2021-22 for R elbow and L hip pain.  Today, pt c/o LBP ongoing since mid-Sept. MOI:***. Pt locates pain to ***  Radiating pain: LE numbness/tingling: LE weakness: Aggravates: Treatments tried:  Pertinent review of systems: ***  Relevant historical information: ***   Exam:  There were no vitals taken for this visit. General: Well Developed, well nourished, and in no acute distress.   MSK: ***    Lab and Radiology Results No results found for this or any previous visit (from the past 72 hour(s)). No results found.     Assessment and Plan: 57 y.o. male with ***   PDMP not reviewed this encounter. No orders of the defined types were placed in this encounter.  No orders of the defined types were placed in this encounter.    Discussed warning signs or symptoms. Please see discharge instructions. Patient expresses understanding.   ***

## 2023-04-12 ENCOUNTER — Ambulatory Visit (INDEPENDENT_AMBULATORY_CARE_PROVIDER_SITE_OTHER): Payer: Commercial Managed Care - PPO

## 2023-04-12 ENCOUNTER — Ambulatory Visit (INDEPENDENT_AMBULATORY_CARE_PROVIDER_SITE_OTHER): Payer: Commercial Managed Care - PPO | Admitting: Family Medicine

## 2023-04-12 VITALS — BP 152/90 | HR 78 | Ht 76.0 in | Wt 276.0 lb

## 2023-04-12 DIAGNOSIS — G8929 Other chronic pain: Secondary | ICD-10-CM | POA: Diagnosis not present

## 2023-04-12 DIAGNOSIS — M5441 Lumbago with sciatica, right side: Secondary | ICD-10-CM | POA: Diagnosis not present

## 2023-04-12 DIAGNOSIS — M5442 Lumbago with sciatica, left side: Secondary | ICD-10-CM

## 2023-04-12 MED ORDER — GABAPENTIN 300 MG PO CAPS
300.0000 mg | ORAL_CAPSULE | Freq: Three times a day (TID) | ORAL | 1 refills | Status: AC | PRN
Start: 2023-04-12 — End: ?

## 2023-04-12 MED ORDER — PREDNISONE 50 MG PO TABS
ORAL_TABLET | ORAL | 0 refills | Status: DC
Start: 2023-04-12 — End: 2023-05-17

## 2023-04-12 NOTE — Patient Instructions (Addendum)
Thank you for coming in today.   Please get an Xray today before you leave   Try using a heating pad.  I've sent a prescription for prednisone & Gabapentin to your pharmacy.   Check back in 5 weeks.  TENS UNIT: This is helpful for muscle pain and spasm.   Search and Purchase a TENS 7000 2nd edition at  www.tenspros.com or www.Amazon.com It should be less than $30.     TENS unit instructions: Do not shower or bathe with the unit on Turn the unit off before removing electrodes or batteries If the electrodes lose stickiness add a drop of water to the electrodes after they are disconnected from the unit and place on plastic sheet. If you continued to have difficulty, call the TENS unit company to purchase more electrodes. Do not apply lotion on the skin area prior to use. Make sure the skin is clean and dry as this will help prolong the life of the electrodes. After use, always check skin for unusual red areas, rash or other skin difficulties. If there are any skin problems, does not apply electrodes to the same area. Never remove the electrodes from the unit by pulling the wires. Do not use the TENS unit or electrodes other than as directed. Do not change electrode placement without consultating your therapist or physician. Keep 2 fingers with between each electrode. Wear time ratio is 2:1, on to off times.    For example on for 30 minutes off for 15 minutes and then on for 30 minutes off for 15 minutes

## 2023-04-14 IMAGING — CT CT ANGIO NECK
3 of 8 series · 10 of 36 positions shown · IV contrast (APPLIED)
Comparison: None.

CLINICAL DATA: Initial evaluation for acute left-sided neck pain,
blurry vision.



[Series 5: cta neck · axial · 0.64mm/px · z∈[-951,-766]mm · 6 of 517 slices shown]
[im 74/517  soft-tissue]
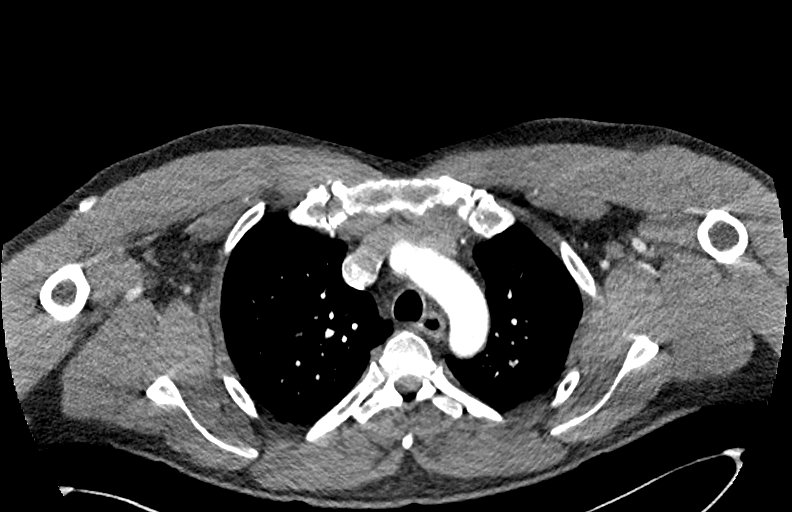
[im 148/517  bone]
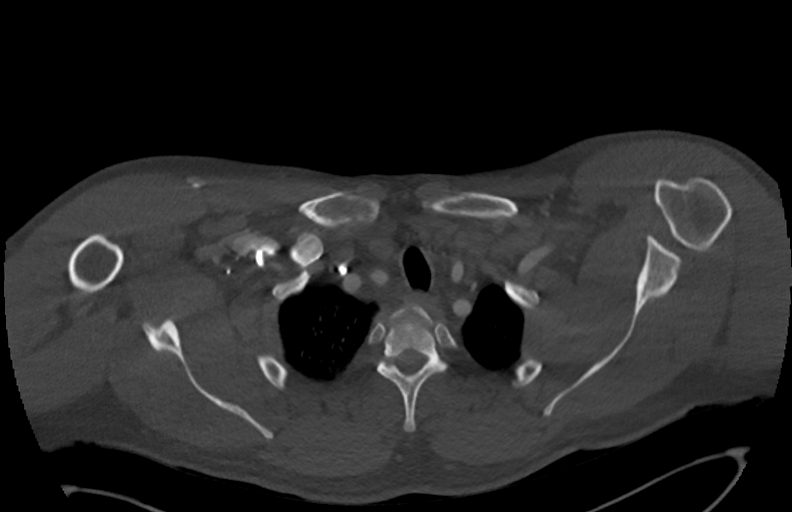
[im 222/517  soft-tissue]
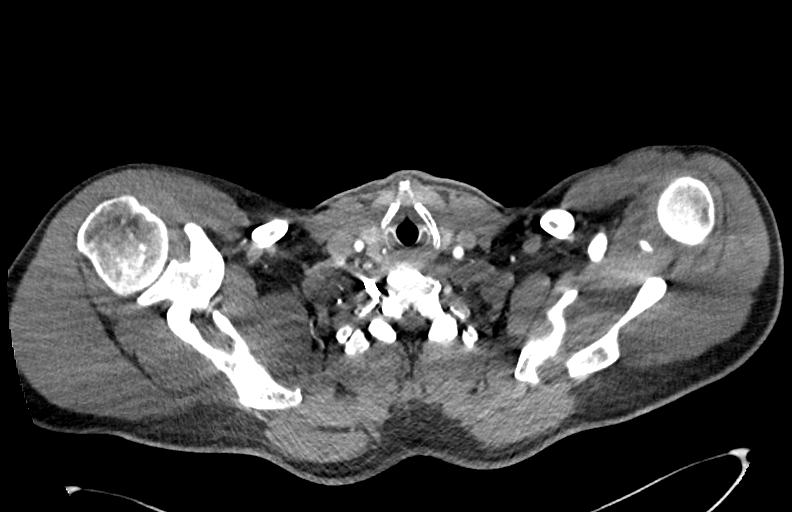
[im 295/517  bone]
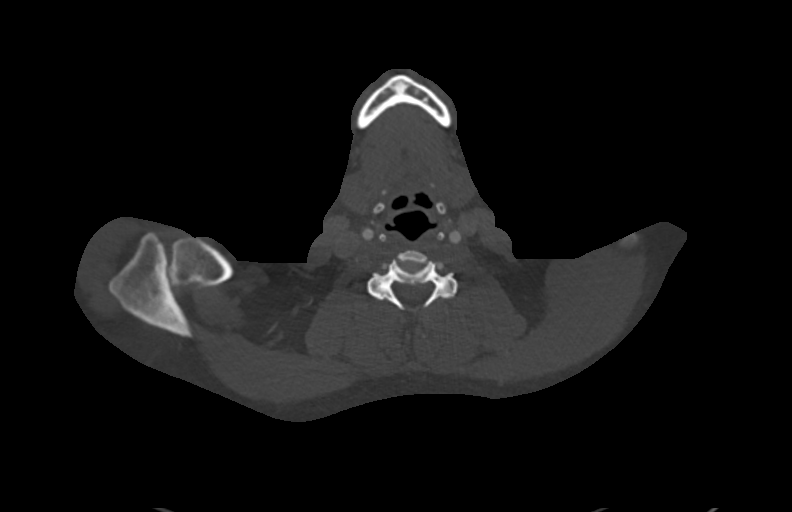
[im 369/517  soft-tissue]
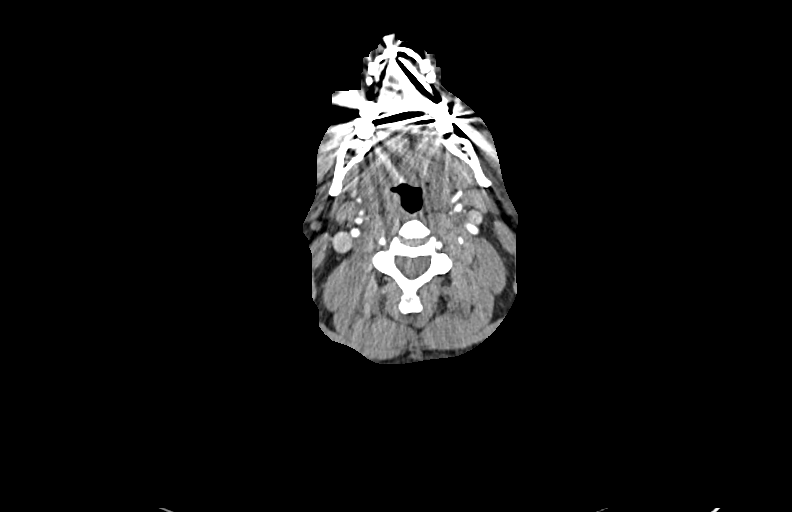
[im 443/517  bone]
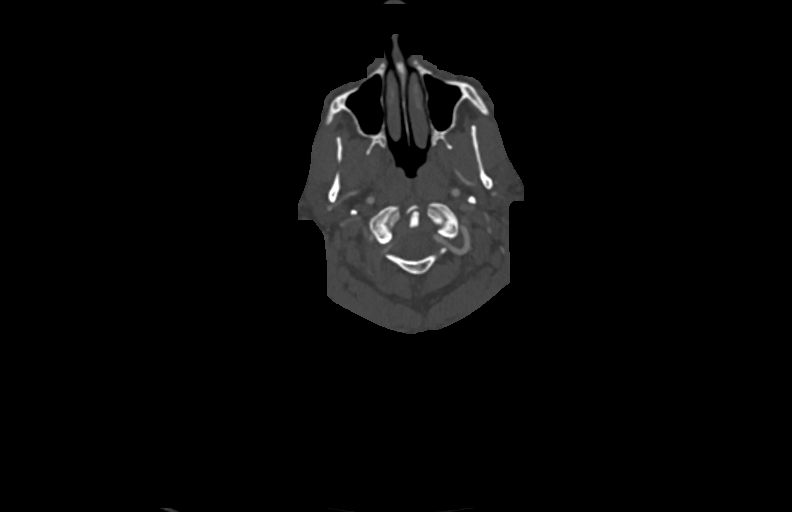

[Series 6: ax thin · axial · 0.64mm/px · z∈[-900,-815]mm · 2 of 257 slices shown]
[im 86/257  soft-tissue]
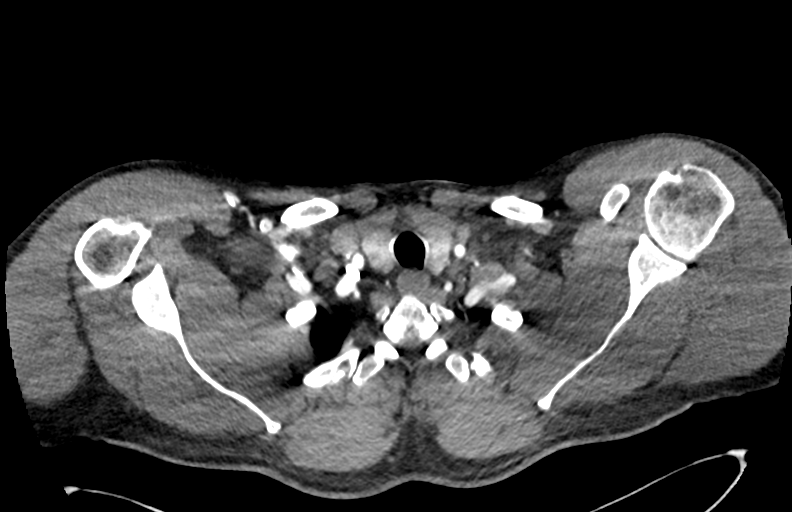
[im 171/257  soft-tissue]
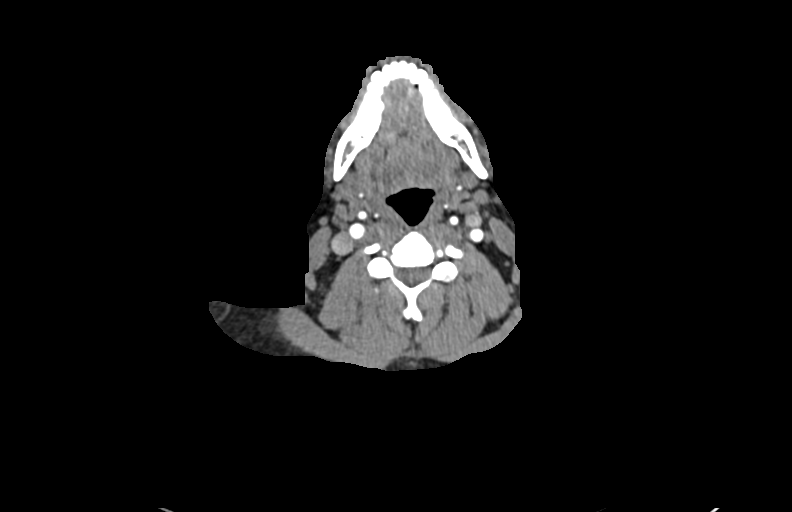

[Series 8: sag thin · sagittal · 0.53mm/px · 2 of 504 slices shown]
[im 143/504  soft-tissue]
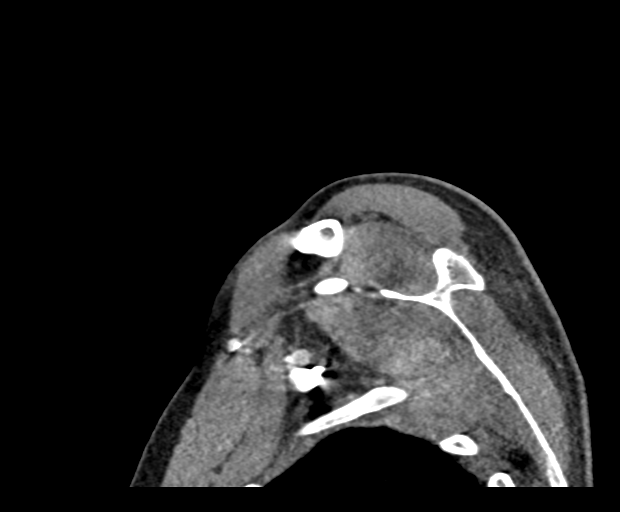
[im 322/504  soft-tissue]
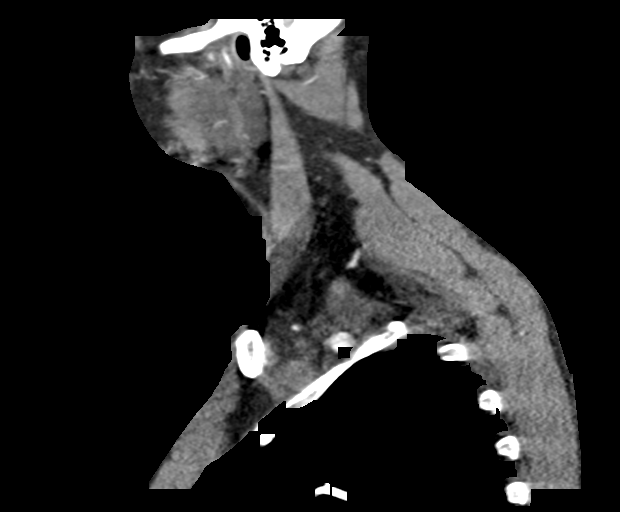

[10 of 36 positions shown; findings below may reference images not displayed]

RADIATION DOSE REDUCTION: This exam was performed according to the
departmental dose-optimization program which includes automated
exposure control, adjustment of the mA and/or kV according to
patient size and/or use of iterative reconstruction technique.

CONTRAST:  75mL OMNIPAQUE IOHEXOL 350 MG/ML SOLN
FINDINGS: Aortic arch: Visualized aortic arch normal caliber with normal
branch pattern. No stenosis or other abnormality about the origin
the great vessels.

Right carotid system: Right common and internal carotid arteries
widely patent without stenosis, dissection or occlusion.

Left carotid system: Left common and internal carotid arteries
widely patent without stenosis, dissection or occlusion.

Vertebral arteries: Both vertebral arteries arise from the
subclavian arteries. No proximal subclavian artery stenosis. Both
vertebral arteries widely patent without stenosis, dissection or
occlusion.

Skeleton: No discrete or worrisome osseous lesions. Mild spondylosis
present at C5-6 and C6-7.

Other neck: No other acute soft tissue abnormality within the neck.

Upper chest: Visualized upper chest demonstrates no acute finding.
IMPRESSION: 1. Normal CTA of the neck. No dissection or other emergent finding.
No significant atheromatous disease or stenosis.
2. Mild spondylosis at C5-6 and C6-7.

## 2023-05-01 ENCOUNTER — Encounter: Payer: Self-pay | Admitting: Cardiovascular Disease

## 2023-05-01 ENCOUNTER — Other Ambulatory Visit: Payer: Self-pay

## 2023-05-01 ENCOUNTER — Ambulatory Visit: Payer: Commercial Managed Care - PPO | Attending: Cardiovascular Disease | Admitting: Cardiovascular Disease

## 2023-05-01 VITALS — BP 120/70 | HR 65 | Ht 77.0 in | Wt 274.0 lb

## 2023-05-01 DIAGNOSIS — I480 Paroxysmal atrial fibrillation: Secondary | ICD-10-CM | POA: Diagnosis not present

## 2023-05-01 DIAGNOSIS — E782 Mixed hyperlipidemia: Secondary | ICD-10-CM

## 2023-05-01 NOTE — Addendum Note (Signed)
Addended by: Bernita Buffy on: 05/01/2023 08:55 AM   Modules accepted: Orders

## 2023-05-01 NOTE — Progress Notes (Signed)
05/01/2023 Roy Bennett   10-03-1965  [id number]  Primary Physician Merri Brunette, MD Primary Cardiologist: Runell Gess MD Nicholes Calamity, [place]  HPI:  Roy Bennett is a 57 y.o.   moderately overweight married Caucasian male father of 2 with no grandchildren who was referred by Dr. Renne Crigler, his PCP, to be established because of PAF.  I last saw him in the office 01/18/2023.  He works as a Geologist, engineering.  He does not smoke.  He does have mild hyperlipidemia intolerant to statin therapy.  His father had a myocardial infarction at age 51 and had bypass surgery.  His brother had sudden cardiac death.  6 of his sisters have A-fib.  He is never had a heart attack or stroke.  He walks for work and is otherwise asymptomatic.  He had an episode of PAF on July 4 after going having a beer with a friend.  He is evaluated at [facility].  His rhythm converted with IV diltiazem.  He was seen by the cardiologist on-call, Dr. Algie Coffer, he began him on Eliquis, carvedilol and Cardizem.  2D echo was essentially normal.  Chest CT showed no evidence of pulmonary embolism.  I last saw him in the office 6 months ago.  He has had no recurrent episodes of A-fib.  He did have a event monitor performed 05/01/2023 that showed occasional PACs, PVCs and a short run of SVT.  We did stop his Eliquis and his beta-blocker.  He had a coronary calcium score done in 2016 which was 0.  He is not very active recently because of back injury 6 weeks ago.  He denies chest pain or shortness of breath.  Current Meds  Medication Sig   Bioflavonoid Products (BIOFLEX) TABS Take by mouth.   cholecalciferol (VITAMIN D3) 25 MCG (1000 UNIT) tablet Take 1,000 Units by mouth daily.   gabapentin (NEURONTIN) 300 MG capsule Take 1 capsule (300 mg total) by mouth 3 (three) times daily as needed.   Omega-3 Fatty Acids (FISH OIL) 1000 MG CAPS Take by mouth.   psyllium (METAMUCIL) 58.6 % packet Take 1  packet by mouth daily.     Allergies  Allergen Reactions   Rosuvastatin Calcium     Other reaction(s): muscle aches    Social History   Socioeconomic History   Marital status: Married    Spouse name: Not on file   Number of children: Not on file   Years of education: Not on file   Highest education level: Not on file  Occupational History   Not on file  Tobacco Use   Smoking status: Never   Smokeless tobacco: Never  Vaping Use   Vaping status: Never Used  Substance and Sexual Activity   Alcohol use: Not Currently    Alcohol/week: 2.0 standard drinks of alcohol    Types: 2 Standard drinks or equivalent per week   Drug use: Never   Sexual activity: Not on file  Other Topics Concern   Not on file  Social History Narrative   Not on file   Social Determinants of Health   Financial Resource Strain: Not on file  Food Insecurity: No Food Insecurity (12/22/2022)   Hunger Vital Sign    Worried About Running Out of Food in the Last Year: Never true    Ran Out of Food in the Last Year: Never true  Transportation Needs: No Transportation Needs (12/22/2022)   PRAPARE - Transportation  Lack of Transportation (Medical): No    Lack of Transportation (Non-Medical): No  Physical Activity: Not on file  Stress: Not on file  Social Connections: Unknown (11/02/2021)   [facility] from [facility]   Social Network    Social Network: Not on file  Intimate Partner Violence: Not At Risk (12/22/2022)   Humiliation, Afraid, Rape, and Kick questionnaire    Fear of Current or Ex-Partner: No    Emotionally Abused: No    Physically Abused: No    Sexually Abused: No     Review of Systems: General: negative for chills, fever, night sweats or weight changes.  Cardiovascular: negative for chest pain, dyspnea on exertion, edema, orthopnea, palpitations, paroxysmal nocturnal dyspnea or shortness of breath Dermatological: negative for rash Respiratory: negative for cough or  wheezing Urologic: negative for hematuria Abdominal: negative for nausea, vomiting, diarrhea, bright red blood per rectum, melena, or hematemesis Neurologic: negative for visual changes, syncope, or dizziness All other systems reviewed and are otherwise negative except as noted above.    Blood pressure 120/70, pulse 65, height 6\' 5"  (1.956 m), weight 274 lb (124.3 kg), SpO2 94%.  General appearance: alert and no distress Neck: no adenopathy, no carotid bruit, no JVD, supple, symmetrical, trachea midline, and thyroid not enlarged, symmetric, no tenderness/mass/nodules Lungs: clear to auscultation bilaterally Heart: regular rate and rhythm, S1, S2 normal, no murmur, click, rub or gallop Extremities: extremities normal, atraumatic, no cyanosis or edema Pulses: 2+ and symmetric Skin: Skin color, texture, turgor normal. No rashes or lesions Neurologic: Grossly normal  EKG not performed today      ASSESSMENT AND PLAN:   Paroxysmal atrial fibrillation (HCC) History PAF on July 4.  He was placed on Eliquis which I subsequently discontinued.  An event monitor done after that showed no evidence of A-fib. This patients CHA2DS2-VASc Score and unadjusted Ischemic Stroke Rate (% per year) is equal to 0.2 % stroke rate/year from a score of 0  Above score calculated as 1 point each if present [CHF, HTN, DM, Vascular=MI/PAD/Aortic Plaque, Age if 65-74, or Male] Above score calculated as 2 points each if present [Age > 75, or Stroke/TIA/TE]   Hyperlipidemia History of hyperlipidemia with LDL recently checked 12/22/22 of 160.  He was on a statin drug which she subsequently stopped.  He has a coronary calcium score of 0 performed in 2016 which I am going to recheck.  If he has a lipid liver profile was performed this morning.  He will most likely need to go back on statin therapy.     Runell Gess MD FACP,FACC,FAHA, [facility] 05/01/2023 8:52 AM

## 2023-05-01 NOTE — Patient Instructions (Addendum)
Lab Work: Lpa today. If you have labs (blood work) drawn today and your tests are completely normal, you will receive your results only by: MyChart Message (if you have MyChart) OR A paper copy in the mail If you have any lab test that is abnormal or we need to change your treatment, we will call you to review the results.   Testing/Procedures: Nanetta Batty  has ordered a CT coronary calcium score.   Test locations:  [facility]  [facility] at [facility]  This is $99 out of pocket.   Coronary CalciumScan A coronary calcium scan is an imaging test used to look for deposits of calcium and other fatty materials (plaques) in the inner lining of the blood vessels of the heart (coronary arteries). These deposits of calcium and plaques can partly clog and narrow the coronary arteries without producing any symptoms or warning signs. This puts a person at risk for a heart attack. This test can detect these deposits before symptoms develop. Tell a health care provider about: Any allergies you have. All medicines you are taking, including vitamins, herbs, eye drops, creams, and over-the-counter medicines. Any problems you or family members have had with anesthetic medicines. Any blood disorders you have. Any surgeries you have had. Any medical conditions you have. Whether you are pregnant or may be pregnant. What are the risks? Generally, this is a safe procedure. However, problems may occur, including: Harm to a pregnant woman and her unborn baby. This test involves the use of radiation. Radiation exposure can be dangerous to a pregnant woman and her unborn baby. If you are pregnant, you generally should not have this procedure done. Slight increase in the risk of cancer. This is because of the radiation involved in the test. What happens before the procedure? No preparation is needed for this  procedure. What happens during the procedure? You will undress and remove any jewelry around your neck or chest. You will put on a hospital gown. Sticky electrodes will be placed on your chest. The electrodes will be connected to an electrocardiogram (ECG) machine to record a tracing of the electrical activity of your heart. A CT scanner will take pictures of your heart. During this time, you will be asked to lie still and hold your breath for 2-3 seconds while a picture of your heart is being taken. The procedure may vary among health care providers and hospitals. What happens after the procedure? You can get dressed. You can return to your normal activities. It is up to you to get the results of your test. Ask your health care provider, or the department that is doing the test, when your results will be ready. Summary A coronary calcium scan is an imaging test used to look for deposits of calcium and other fatty materials (plaques) in the inner lining of the blood vessels of the heart (coronary arteries). Generally, this is a safe procedure. Tell your health care provider if you are pregnant or may be pregnant. No preparation is needed for this procedure. A CT scanner will take pictures of your heart. You can return to your normal activities after the scan is done. This information is not intended to replace advice given to you by your health care provider. Make sure you discuss any questions you have with your health care provider. Document Released: 12/03/2007 Document Revised: 04/25/2016 Document Reviewed: 04/25/2016 Elsevier Interactive Patient Education  2017 [employer].  Follow-Up: At [facility], you and your health needs are our priority.  As part of our continuing mission to provide you with exceptional heart care, we have created designated Provider Care Teams.  These Care Teams include your primary Cardiologist (physician) and Advanced Practice Providers (APPs -   Physician Assistants and Nurse Practitioners) who all work together to provide you with the care you need, when you need it.  We recommend signing up for the patient portal called "MyChart".  Sign up information is provided on this After Visit Summary.  MyChart is used to connect with patients for Virtual Visits (Telemedicine).  Patients are able to view lab/test results, encounter notes, upcoming appointments, etc.  Non-urgent messages can be sent to your provider as well.   To learn more about what you can do with MyChart, go to [email or web address].    Your next appointment:   12 month(s)  Provider:   Nanetta Batty MD

## 2023-05-01 NOTE — Assessment & Plan Note (Signed)
History of hyperlipidemia with LDL recently checked 12/22/22 of 160.  He was on a statin drug which she subsequently stopped.  He has a coronary calcium score of 0 performed in 2016 which I am going to recheck.  If he has a lipid liver profile was performed this morning.  He will most likely need to go back on statin therapy.

## 2023-05-01 NOTE — Assessment & Plan Note (Signed)
History PAF on July 4.  He was placed on Eliquis which I subsequently discontinued.  An event monitor done after that showed no evidence of A-fib. This patients CHA2DS2-VASc Score and unadjusted Ischemic Stroke Rate (% per year) is equal to 0.2 % stroke rate/year from a score of 0  Above score calculated as 1 point each if present [CHF, HTN, DM, Vascular=MI/PAD/Aortic Plaque, Age if 65-74, or Male] Above score calculated as 2 points each if present [Age > 75, or Stroke/TIA/TE]

## 2023-05-03 LAB — HEPATIC FUNCTION PANEL
ALT: 31 IU/L (ref 0–44)
AST: 19 [IU]/L (ref 0–40)
Albumin: 4.3 g/dL (ref 3.8–4.9)
Alkaline Phosphatase: 65 [IU]/L (ref 44–121)
Bilirubin Total: 0.4 mg/dL (ref 0.0–1.2)
Bilirubin, Direct: 0.17 mg/dL (ref 0.00–0.40)
Total Protein: 6.7 g/dL (ref 6.0–8.5)

## 2023-05-03 LAB — LIPID PANEL
Chol/HDL Ratio: 5.2 ratio — ABNORMAL HIGH (ref 0.0–5.0)
Cholesterol, Total: 224 mg/dL — ABNORMAL HIGH (ref 100–199)
HDL: 43 mg/dL (ref >39)
LDL Chol Calc (NIH): 156 mg/dL — ABNORMAL HIGH (ref 0–99)
Triglycerides: 140 mg/dL (ref 0–149)
VLDL Cholesterol Cal: 25 mg/dL (ref 5–40)

## 2023-05-03 LAB — LIPOPROTEIN A (LPA): Lipoprotein (a): 138.9 nmol/L — ABNORMAL HIGH (ref <75.0)

## 2023-05-04 MED ORDER — ATORVASTATIN CALCIUM 40 MG PO TABS
40.0000 mg | ORAL_TABLET | Freq: Every day | ORAL | 3 refills | Status: AC
Start: 2023-05-04 — End: 2023-08-02

## 2023-05-08 NOTE — Progress Notes (Signed)
 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

## 2023-05-16 NOTE — Progress Notes (Unsigned)
   Rubin Payor, PhD, LAT, ATC acting as a scribe for Clementeen Graham, MD.  Roy Bennett is a 57 y.o. male who presents to [facility] at [facility] today for f/u LBP. Pt was last seen by Dr. Denyse Amass on 04/12/23 and was prescribed gabapentin, prednisone, and referred to [facility].  Today, pt reports he's been working w/ PT. He feels some improvement, but is sore today, do to an intense PT session on Monday. Pt locates pain to distal portion of how low back and into buttocks w/ radiating pain along the posterior aspect of both legs, R>L.  Dx imaging: 04/12/23 L-spine XR  Pertinent review of systems: No fever or chills  Relevant historical information: Paroxysmal atrial fibrillation not on any current blood thinner.   Exam:  BP (!) 146/92   Pulse 89   Ht 6\' 5"  (1.956 m)   Wt 276 lb (125.2 kg)   SpO2 97%   BMI 32.73 kg/m  General: Well Developed, well nourished, and in no acute distress.   MSK: L-spine normal appearing Decreased lumbar motion. Antalgic gait.    Lab and Radiology Results  EXAM: LUMBAR SPINE - 2-3 VIEW   COMPARISON:  None Available.   FINDINGS: Five non-rib-bearing lumbar vertebra. Straightening of normal lordosis. No fracture or compression deformity. Diffuse anterior spurring, disc space narrowing at L3-L4, L4-L5, and L5-S1. No evidence of pars defect or focal bone abnormality. The sacroiliac joints are congruent.   IMPRESSION: Degenerative disc disease at L3-L4, L4-L5, and L5-S1.     Electronically Signed   By: Narda Rutherford M.D.   On: 05/04/2023 19:48 I, Clementeen Graham, personally (independently) visualized and performed the interpretation of the images attached in this note.     Assessment and Plan: 57 y.o. male with chronic low back pain with lumbar radiculopathy.  Unfortunately his symptoms persist despite physical therapy and home exercise program over the last 6 weeks.  He is a little bit improved but is still quite  symptomatic and having difficulty with gait.  Plan for MRI lumbar spine to further evaluate source of pain and for potential treatment options such as epidural steroid injection.  Anticipate direct order epidural steroid injection following MRI.  Prednisone refilled today as we have the Thanksgiving holiday coming up.   PDMP not reviewed this encounter. Orders Placed This Encounter  Procedures   MR LUMBAR SPINE WO CONTRAST    Standing Status:   Future    Standing Expiration Date:   06/16/2023    Order Specific Question:   What is the patient's sedation requirement?    Answer:   No Sedation    Order Specific Question:   Does the patient have a pacemaker or implanted devices?    Answer:   No    Order Specific Question:   Preferred imaging location?    Answer:   [facility] (table limit-350lbs)   Meds ordered this encounter  Medications   predniSONE (DELTASONE) 50 MG tablet    Sig: Take 1 pill daily for 5 days    Dispense:  5 tablet    Refill:  0     Discussed warning signs or symptoms. Please see discharge instructions. Patient expresses understanding.   The above documentation has been reviewed and is accurate and complete Clementeen Graham, M.D.

## 2023-05-17 ENCOUNTER — Encounter: Payer: Self-pay | Admitting: Family Medicine

## 2023-05-17 ENCOUNTER — Ambulatory Visit: Payer: Commercial Managed Care - PPO | Admitting: Family Medicine

## 2023-05-17 DIAGNOSIS — M5442 Lumbago with sciatica, left side: Secondary | ICD-10-CM

## 2023-05-17 DIAGNOSIS — M5441 Lumbago with sciatica, right side: Secondary | ICD-10-CM | POA: Diagnosis not present

## 2023-05-17 DIAGNOSIS — G8929 Other chronic pain: Secondary | ICD-10-CM | POA: Diagnosis not present

## 2023-05-17 MED ORDER — PREDNISONE 50 MG PO TABS
ORAL_TABLET | ORAL | 0 refills | Status: DC
Start: 2023-05-17 — End: 2023-07-26

## 2023-05-17 NOTE — Patient Instructions (Addendum)
Thank you for coming in today.   You should hear from MRI scheduling within 1 week. If you do not hear please let me know.    TENS UNIT: This is helpful for muscle pain and spasm.   Search and Purchase a TENS 7000 2nd edition at  www.tenspros.com or www.Amazon.com It should be less than $30.     TENS unit instructions: Do not shower or bathe with the unit on Turn the unit off before removing electrodes or batteries If the electrodes lose stickiness add a drop of water to the electrodes after they are disconnected from the unit and place on plastic sheet. If you continued to have difficulty, call the TENS unit company to purchase more electrodes. Do not apply lotion on the skin area prior to use. Make sure the skin is clean and dry as this will help prolong the life of the electrodes. After use, always check skin for unusual red areas, rash or other skin difficulties. If there are any skin problems, does not apply electrodes to the same area. Never remove the electrodes from the unit by pulling the wires. Do not use the TENS unit or electrodes other than as directed. Do not change electrode placement without consultating your therapist or physician. Keep 2 fingers with between each electrode. Wear time ratio is 2:1, on to off times.    For example on for 30 minutes off for 15 minutes and then on for 30 minutes off for 15 minutes

## 2023-05-29 ENCOUNTER — Ambulatory Visit: Payer: Commercial Managed Care - PPO

## 2023-05-29 ENCOUNTER — Ambulatory Visit (INDEPENDENT_AMBULATORY_CARE_PROVIDER_SITE_OTHER): Payer: Self-pay

## 2023-05-29 DIAGNOSIS — I480 Paroxysmal atrial fibrillation: Secondary | ICD-10-CM

## 2023-05-29 DIAGNOSIS — E782 Mixed hyperlipidemia: Secondary | ICD-10-CM

## 2023-05-30 ENCOUNTER — Other Ambulatory Visit: Payer: Commercial Managed Care - PPO

## 2023-06-05 ENCOUNTER — Telehealth: Payer: Self-pay

## 2023-06-05 ENCOUNTER — Other Ambulatory Visit: Payer: Self-pay

## 2023-06-05 DIAGNOSIS — G8929 Other chronic pain: Secondary | ICD-10-CM

## 2023-06-05 NOTE — Telephone Encounter (Signed)
Roy Bennett reached out via teams reporting the MRI order expired prior to the date of service scheduled. I checked w/ our referral coordinator and was advised to re-order MRI. Completed.

## 2023-06-20 ENCOUNTER — Ambulatory Visit: Payer: Commercial Managed Care - PPO

## 2023-06-20 DIAGNOSIS — M5416 Radiculopathy, lumbar region: Secondary | ICD-10-CM | POA: Diagnosis not present

## 2023-06-20 DIAGNOSIS — G8929 Other chronic pain: Secondary | ICD-10-CM

## 2023-06-28 NOTE — Progress Notes (Signed)
 [name], PhD, LAT, ATC acting as a scribe for [name], MD.  Roy Bennett is a 58 y.o. male who presents to [facility] at [facility] today for f/u LBP w/ MRI review. Pt was last seen by Dr. Lloyd on 05/17/23 and prednisone  was refilled and MRI ordered.   Today, pt reports very slight, slow improvement in his pain. Radicular pain continues into bilat legs, R>L. He notes a localized area of pain on the posterior aspect of his R knee.   Dx imaging: 06/20/23 L-spine MRI 04/12/23 L-spine XR   Pertinent review of systems: No fevers or chills  Relevant historical information: Atrial fibrillation not anticoagulated at this time.   Exam:  BP (!) 154/92   Pulse 87   Ht 6' 5 (1.956 m)   Wt 275 lb (124.7 kg)   SpO2 94%   BMI 32.61 kg/m  General: Well Developed, well nourished, and in no acute distress.   MSK: L-spine normal appearing normal motion lower extremity strength is intact.    Lab and Radiology Results  Narrative & Impression  CLINICAL DATA:  Lumbar radiculopathy. Symptoms persist with greater than 6 weeks of treatment. Severe pain extending into both legs.   EXAM: MRI LUMBAR SPINE WITHOUT CONTRAST   TECHNIQUE: Multiplanar, multisequence MR imaging of the lumbar spine was performed. No intravenous contrast was administered.   COMPARISON:  Radiography 04/12/2023   FINDINGS: Segmentation:  5 lumbar type vertebral bodies.   Alignment:  No malalignment.   Vertebrae: No fracture or focal bone lesion. Minimal discogenic endplate marrow edema on the right at L5-S1.   Conus medullaris and cauda equina: Conus extends to the L1-2 level. Conus and cauda equina appear normal.   Paraspinal and other soft tissues: Negative   Disc levels:   T12-L1: Normal   L1-2: Mild bulging of the disc.  No stenosis.   L2-3: Mild bulging of the disc.  No stenosis.   L3-4: Mild bulging of the disc. Mild narrowing of the lateral recesses but no  neural compression.   L4-5: Broad-based disc herniation more prominent in the left posterolateral direction. Bilateral chronic facet and ligamentous hypertrophy. Severe multifactorial stenosis at this level that could cause neural compression on either or both sides, particularly the left.   L5-S1: No disc bulge or herniation. No stenosis of the canal or foramina. Minimal discogenic endplate marrow change on the right.   IMPRESSION: 1. L4-5: Broad-based disc herniation more prominent in the left posterolateral direction. Bilateral chronic facet and ligamentous hypertrophy. Severe multifactorial stenosis at this level that could cause neural compression on either or both sides, particularly the left. 2. L3-4: Mild bulging of the disc. Mild narrowing of the lateral recesses but no neural compression. 3. L5-S1: Minimal discogenic endplate marrow change on the right. No stenosis of the canal or foramina.     Electronically Signed   By: Oneil Officer M.D.   On: 06/28/2023 08:54     I, [name], personally (independently) visualized and performed the interpretation of the images attached in this note.     Assessment and Plan: 58 y.o. male with lumbar radiculopathy secondary to spinal stenosis at L4-5.  Symptoms are bilateral right worse than left.  Fortunately he does not have weakness at this time.  Plan for trial of epidural steroid injection. Additionally we will try aquatic physical therapy as he has had some benefit in the past with that.  If not improving consider neurosurgery or orthopedic spine surgery  consultation for second opinion and surgical evaluation of the spinal stenosis.  We discussed dosing of gabapentin  and prednisone .  He has both a backup course of prednisone  ready to go if he needs it and does have remaining gabapentin  he can take at bedtime if needed.  PDMP not reviewed this encounter. Orders Placed This Encounter  Procedures   DG INJECT DIAG/THERA/INC  NEEDLE/CATH/PLC EPI/LUMB/SAC W/IMG    Level and technique per radiology    Standing Status:   Future    Expiration Date:   07/30/2023    Reason for Exam (SYMPTOM  OR DIAGNOSIS REQUIRED):   Low back pain    Preferred Imaging Location?:   [facility]   Ambulatory referral to Physical Therapy    Referral Priority:   Routine    Referral Type:   Physical Medicine    Referral Reason:   Specialty Services Required    Requested Specialty:   Physical Therapy    Number of Visits Requested:   1   No orders of the defined types were placed in this encounter.    Discussed warning signs or symptoms. Please see discharge instructions. Patient expresses understanding.   The above documentation has been reviewed and is accurate and complete [name], M.D.

## 2023-06-29 ENCOUNTER — Ambulatory Visit (INDEPENDENT_AMBULATORY_CARE_PROVIDER_SITE_OTHER): Payer: Commercial Managed Care - PPO | Admitting: Family Medicine

## 2023-06-29 ENCOUNTER — Encounter: Payer: Self-pay | Admitting: Family Medicine

## 2023-06-29 VITALS — BP 154/92 | HR 87 | Ht 77.0 in | Wt 275.0 lb

## 2023-06-29 DIAGNOSIS — M5442 Lumbago with sciatica, left side: Secondary | ICD-10-CM | POA: Diagnosis not present

## 2023-06-29 DIAGNOSIS — G8929 Other chronic pain: Secondary | ICD-10-CM

## 2023-06-29 DIAGNOSIS — M5441 Lumbago with sciatica, right side: Secondary | ICD-10-CM | POA: Diagnosis not present

## 2023-06-29 NOTE — Patient Instructions (Addendum)
 Thank you for coming in today.   Please call [facility]) at [phone] to schedule your spine injection.    I've referred you to Aquatic Physical Therapy.  Let us  know if you don't hear from them in one week.  Let me know if not improving and if you would like a surgical consultation.

## 2023-06-29 NOTE — Progress Notes (Signed)
 Lumbar spine MRI shows areas were nerves are pinched causing leg pain.  Additionally you have arthritis in your back which could cause back pain.  Will talk about this in full detail during your visit today.

## 2023-07-17 ENCOUNTER — Ambulatory Visit (HOSPITAL_BASED_OUTPATIENT_CLINIC_OR_DEPARTMENT_OTHER): Payer: Commercial Managed Care - PPO | Admitting: Physical Therapy

## 2023-07-17 NOTE — Discharge Instructions (Signed)

## 2023-07-18 ENCOUNTER — Inpatient Hospital Stay
Admission: RE | Admit: 2023-07-18 | Discharge: 2023-07-18 | Disposition: A | Discharge status: Home / Self Care | Payer: Commercial Managed Care - PPO | Source: Ambulatory Visit | Attending: Family Medicine | Admitting: Family Medicine

## 2023-07-21 NOTE — Discharge Instructions (Signed)

## 2023-07-24 ENCOUNTER — Inpatient Hospital Stay
Admission: RE | Admit: 2023-07-24 | Discharge: 2023-07-24 | Disposition: A | Discharge status: Home / Self Care | Payer: Commercial Managed Care - PPO | Source: Ambulatory Visit | Attending: Family Medicine | Admitting: Family Medicine

## 2023-07-24 ENCOUNTER — Telehealth: Payer: Self-pay | Admitting: *Deleted

## 2023-07-24 ENCOUNTER — Ambulatory Visit (HOSPITAL_BASED_OUTPATIENT_CLINIC_OR_DEPARTMENT_OTHER): Payer: Commercial Managed Care - PPO | Admitting: Physical Therapy

## 2023-07-24 ENCOUNTER — Other Ambulatory Visit: Payer: Self-pay

## 2023-07-24 DIAGNOSIS — G8929 Other chronic pain: Secondary | ICD-10-CM

## 2023-07-24 NOTE — Telephone Encounter (Signed)
Pt called. [facility] said his epidural order has expired & we need to enter a new order so pt can schedule appt.

## 2023-07-24 NOTE — Telephone Encounter (Signed)
ESI ordered. Pt sent MyChart message informing him.

## 2023-07-26 ENCOUNTER — Telehealth: Payer: Self-pay | Admitting: Family Medicine

## 2023-07-26 DIAGNOSIS — G8929 Other chronic pain: Secondary | ICD-10-CM

## 2023-07-26 MED ORDER — PREDNISONE 50 MG PO TABS: ORAL_TABLET | ORAL | 0 refills | Status: AC

## 2023-07-26 NOTE — Telephone Encounter (Signed)
 Prednisone  sent to [facility] in [facility].

## 2023-07-26 NOTE — Telephone Encounter (Signed)
 Pt wife called requesting refill of steroids or something to help, pt still having pain and trouble sleeping. He had the epidural scheduled for 2/12 but will have to reschedule as pt has a business trip.

## 2023-08-02 ENCOUNTER — Other Ambulatory Visit: Payer: Commercial Managed Care - PPO

## 2023-08-02 NOTE — Therapy (Addendum)
 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 Patient goals were not met. Patient is being discharged due to not returning since the last visit.  Patient Name: Roy Bennett MRN: [id number] DOB:Oct 09, 1965, 58 y.o., male Today's Date: 08/03/2023  Addend Adriana Hopping [facility]) Bernetha Anschutz MPT 11/08/23 10:47 AM [facility] [address] [place], [place], [address] Phone: [phone]   Fax:  [phone]   END OF SESSION:  PT End of Session - 08/03/23 1018     Visit Number 1    Number of Visits 16    Date for PT Re-Evaluation 09/29/23    Authorization Type [employer]    PT Start Time 0800    PT Stop Time 0844    PT Time Calculation (min) 44 min    Activity Tolerance Patient tolerated treatment well    Behavior During Therapy [facility] for tasks assessed/performed             Past Medical History:  Diagnosis Date   Hyperlipidemia    Nausea & vomiting 12/22/2022   Past Surgical History:  Procedure Laterality Date   BRONCHIAL BRUSHINGS  10/12/2020   Procedure: BRONCHIAL BRUSHINGS;  Surgeon: Denson Flake, MD;  Location: [facility] ENDOSCOPY;  Service: Pulmonary;;   BRONCHIAL NEEDLE ASPIRATION BIOPSY  10/12/2020   Procedure: BRONCHIAL NEEDLE ASPIRATION BIOPSIES;  Surgeon: Denson Flake, MD;  Location: [facility] ENDOSCOPY;  Service: Pulmonary;;   BRONCHIAL WASHINGS  10/12/2020   Procedure: BRONCHIAL WASHINGS;  Surgeon: Denson Flake, MD;  Location: [facility] ENDOSCOPY;  Service: Pulmonary;;   VIDEO BRONCHOSCOPY WITH ENDOBRONCHIAL NAVIGATION Bilateral 10/12/2020   Procedure: VIDEO BRONCHOSCOPY WITH ENDOBRONCHIAL NAVIGATION;  Surgeon: Denson Flake, MD;  Location: [facility] ENDOSCOPY;  Service: Pulmonary;  Laterality:  Bilateral;   VIDEO BRONCHOSCOPY WITH ENDOBRONCHIAL ULTRASOUND Bilateral 10/12/2020   Procedure: VIDEO BRONCHOSCOPY WITH ENDOBRONCHIAL ULTRASOUND;  Surgeon: Denson Flake, MD;  Location: [facility] ENDOSCOPY;  Service: Pulmonary;  Laterality: Bilateral;   Patient Active Problem List   Diagnosis Date Noted   Hyperlipidemia 01/18/2023   Paroxysmal atrial fibrillation (HCC) 12/22/2022   Nausea & vomiting 12/22/2022   Atrial fibrillation (HCC) 12/22/2022   Mediastinal adenopathy    Pulmonary nodules/lesions, multiple 09/22/2020   Lateral epicondylitis, right elbow 09/08/2020   Primary osteoarthritis of left hip 01/03/2020   Chronic left hip pain 12/09/2019   Pes planus of both feet 04/15/2014    PCP: Imelda Man MD  REFERRING PROVIDER:   Syliva Even, MD    REFERRING DIAG: M54.42,M54.41,G89.29 (ICD-10-CM) - Chronic bilateral low back pain with bilateral sciatica   Rationale for Evaluation and Treatment: Rehabilitation  THERAPY DIAG:  Other low back pain  Muscle weakness (generalized)  Abnormal posture  ONSET DATE: 6 months  SUBJECTIVE:  SUBJECTIVE STATEMENT: 2010 lb lumbar herniation L4-5 in [place]. Had aquatic PT and was better after 8 weeks. This LBP  flare has been going on for about 6 months.  Have not been moving much. Had some therapy "dry" therapy and did  not help. Have injection scheduled for tomorrow. Not sleeping well. Waking at night x 8. Radiating pain into legs unless positioned properly back straight and hips/knees bent. Side lying pain R>L.  PERTINENT HISTORY:  Oa Left hip  PAIN:  Are you having pain? Yes: NPRS scale: current/normal 4-5/10; worst 10/10; least 1-2/10 Pain location: LB  with radiation R>L to calves Pain description: ache, radiation, sharp Aggravating factors:  Sitting with knees above hips Relieving factors: positioning; gabapentin /prednisone   PRECAUTIONS: None  RED FLAGS: None   WEIGHT BEARING RESTRICTIONS: No  FALLS:  Has patient fallen in last 6 months? No  LIVING ENVIRONMENT: Lives with: lives with their family Lives in: House/apartment Has following equipment at home: None  OCCUPATION: desk job  PLOF: Independent  PATIENT GOALS: decrease pain, return to [employer]  NEXT MD VISIT: tomorrow  OBJECTIVE:  Note: Objective measures were completed at Evaluation unless otherwise noted.  DIAGNOSTIC FINDINGS:  MRI lumbar spine IMPRESSION: 1. L4-5: Broad-based disc herniation more prominent in the left posterolateral direction. Bilateral chronic facet and ligamentous hypertrophy. Severe multifactorial stenosis at this level that could cause neural compression on either or both sides, particularly the left. 2. L3-4: Mild bulging of the disc. Mild narrowing of the lateral recesses but no neural compression. 3. L5-S1: Minimal discogenic endplate marrow change on the right. No stenosis of the canal or foramina.  PATIENT SURVEYS:  Modified Oswestry 44%   COGNITION: Overall cognitive status: Within functional limits for tasks assessed     SENSATION: WFL  MUSCLE LENGTH: Hamstrings: Right 40 deg; Left 60 deg   POSTURE: forward head, decreased lumbar lordosis, and left pelvic obliquity  PALPATION: No TTP  LUMBAR ROM:   AROM eval  Flexion Full P!  Extension Limited 50% P!  Right lateral flexion full  Left lateral flexion Limited 50% P!  Right rotation   Left rotation    (Blank rows = not tested)  LOWER EXTREMITY ROM:     WFL  LOWER EXTREMITY MMT:    MMT Right eval Left eval  Hip flexion 21.8 23.4  Hip extension    Hip abduction 33.1 30.4  Hip adduction    Hip internal rotation    Hip external rotation    Knee flexion    Knee extension 32.6 29.6  Ankle dorsiflexion    Ankle plantarflexion    Ankle  inversion    Ankle eversion     (Blank rows = not tested)  LUMBAR SPECIAL TESTS:  Straight leg raise test: Positive and Thomas test: Positive slump test neg  FUNCTIONAL TESTS:  Timed up and go (TUG): 13.71  GAIT: Distance walked: unlimited Comments: gait guarded, forward head, shoulder retraction limited arm swing, pelvic rotation  TREATMENT  Eval  Pt seen for aquatic therapy today.  Treatment took place in water 3.5-4.75 ft in depth at the [facility] pool. Temp of water was 91.  Pt entered/exited the pool via stairs using alternating pattern with hand rail.  *intro to setting *Walking all directions *Straddling noodle: decompression and cycling  Pt requires the buoyancy and hydrostatic pressure of water for support, and to offload joints by unweighting joint load by at least 50 % in navel deep water and by at least 75-80% in chest to  neck deep water.  Viscosity of the water is needed for resistance of strengthening. Water current perturbations provides challenge to standing balance requiring increased core activation.                                                                                                                               PATIENT EDUCATION:  Education details: Discussed eval findings, rehab rationale, aquatic program progression/POC and pools in area. Patient is in agreement  Person educated: Patient Education method: Explanation Education comprehension: verbalized understanding  HOME EXERCISE PROGRAM: tba  ASSESSMENT:  CLINICAL IMPRESSION: Patient is a 58 y.o. m who was seen today for physical therapy evaluation and treatment for LBP. He presents with signs and symptoms in accordance with dx of severe L4-5 spinal stenosis causing neural compression with radicular pattern into bilateral le R>L.  He is scheduled for a steroid injection tomorrow. His pain is greater with forward flexed/slumped postioning, and side lying, upright posture decreases  pain. He demonstrates significant muscle weakness in core and le, reports inability to rise from a squatted position without UE support, and he has a left pelvic obliquity adding a structural gait deviation along with guarded positioning due to pain and muscle tightness.  He will benefit from skilled physical therapy to improve all deficits to improve pt functional mobility, gait and toleration to activity. Will initiate in aquatics then transition to land as pain reduces for added load increasing strength as tolerated. Pt enters pool demonstrating safety and indep with therapist instructing from deck. He is familiar with water therapy.  OBJECTIVE IMPAIRMENTS: Abnormal gait, decreased strength, impaired flexibility, improper body mechanics, and pain.   ACTIVITY LIMITATIONS: lifting, bending, squatting, sleeping, stairs, transfers, bed mobility, and locomotion level  PARTICIPATION LIMITATIONS: driving, community activity, and yard work  PERSONAL FACTORS: Time since onset of injury/illness/exacerbation are also affecting patient's functional outcome.   REHAB POTENTIAL: Good  CLINICAL DECISION MAKING: Evolving/moderate complexity  EVALUATION COMPLEXITY: Moderate   GOALS: Goals reviewed with patient? Yes  SHORT TERM GOALS: Target date: 08/31/23  Pt will tolerate full aquatic sessions consistently without increase in pain and with improving function to demonstrate good toleration and effectiveness of intervention.  Baseline: Goal status: INITIAL  2.  Pt will perform 10 consecutive STS from 4th water step Baseline:  Goal status: INITIAL  3.  Pt will improve ROM of lumbar spine to full all directions without limitation to pain for improve function, decrease risk of injury and pain and improve quality of life Baseline:  Goal status: INITIAL    LONG TERM GOALS: Target date: 09/29/23  Pt to improve on ODI by at least 20% to demonstrate statistically significant Improvement in  function. Baseline: 20/45= 44% Goal status: INITIAL  2.  Pt will improve strength in hip flex by at least 20 lbs to demonstrate improved overall physical function Baseline:  Goal status: INITIAL  3.  Pt will report decrease pain by at least 75% for  improved toleration to activity/quality of life and to demonstrate improved management of pain. Baseline:  Goal status: INITIAL  4.  Pt will be indep with proper body mechanics for lifting and with floor transfers Baseline: unable Goal status: INITIAL  5.  Pt will demonstrate improved upright posture without guarding and level pelvis Baseline:  Goal status: INITIAL  6.  Pt will be indep with final HEP's (land and aquatic as appropriate)/return to consistent indep exercise regimen for continued management of condition Baseline:  Goal status: INITIAL  PLAN:  PT FREQUENCY: 1-2x/week  PT DURATION: 8 weeks  PLANNED INTERVENTIONS: 97164- PT Re-evaluation, 97110-Therapeutic exercises, 97530- Therapeutic activity, 97112- Neuromuscular re-education, 97535- Self Care, 16109- Manual therapy, [id number]- Gait training, [phone]- Orthotic Fit/training, [phone]- Aquatic Therapy, 97014- Electrical stimulation (unattended), [phone]- Ionotophoresis 4mg /ml Dexamethasone , Patient/Family education, Balance training, Stair training, Taping, Dry Needling, Joint mobilization, DME instructions, Cryotherapy, and Moist heat.  PLAN FOR NEXT SESSION: aquatic: Pain management; lumbar and pelvic stretching; hip flex and hs stretching; strengthening, balance retraining. Land: Body mechanics; posture; post core stretching and strengthening; floor transfers, HEP   Lucinda Saber) Taimane Stimmel MPT 08/03/23 10:30 AM [address] [facility] [address] [place], [place], [address] Phone: [phone]   Fax:  [phone]

## 2023-08-03 ENCOUNTER — Ambulatory Visit (HOSPITAL_BASED_OUTPATIENT_CLINIC_OR_DEPARTMENT_OTHER): Payer: Commercial Managed Care - PPO | Attending: Family Medicine | Admitting: Physical Therapy

## 2023-08-03 ENCOUNTER — Encounter (HOSPITAL_BASED_OUTPATIENT_CLINIC_OR_DEPARTMENT_OTHER): Payer: Self-pay | Admitting: Physical Therapy

## 2023-08-03 ENCOUNTER — Other Ambulatory Visit: Payer: Self-pay

## 2023-08-03 DIAGNOSIS — R293 Abnormal posture: Secondary | ICD-10-CM

## 2023-08-03 DIAGNOSIS — G8929 Other chronic pain: Secondary | ICD-10-CM | POA: Insufficient documentation

## 2023-08-03 DIAGNOSIS — M5442 Lumbago with sciatica, left side: Secondary | ICD-10-CM | POA: Diagnosis not present

## 2023-08-03 DIAGNOSIS — M5459 Other low back pain: Secondary | ICD-10-CM

## 2023-08-03 DIAGNOSIS — M5441 Lumbago with sciatica, right side: Secondary | ICD-10-CM | POA: Insufficient documentation

## 2023-08-03 DIAGNOSIS — M6281 Muscle weakness (generalized): Secondary | ICD-10-CM | POA: Diagnosis present

## 2023-08-03 NOTE — Discharge Instructions (Signed)

## 2023-08-04 ENCOUNTER — Ambulatory Visit
Admission: RE | Admit: 2023-08-04 | Discharge: 2023-08-04 | Disposition: A | Discharge status: Home / Self Care | Payer: Commercial Managed Care - PPO | Source: Ambulatory Visit | Attending: Family Medicine | Admitting: Family Medicine

## 2023-08-04 DIAGNOSIS — G8929 Other chronic pain: Secondary | ICD-10-CM

## 2023-08-04 MED ORDER — IOPAMIDOL (ISOVUE-M 200) INJECTION 41%
1.0000 mL | Freq: Once | INTRAMUSCULAR | Status: AC
  Administered 2023-08-04: 1 mL via EPIDURAL

## 2023-08-04 MED ORDER — METHYLPREDNISOLONE ACETATE 40 MG/ML INJ SUSP (RADIOLOG
80.0000 mg | Freq: Once | INTRAMUSCULAR | Status: AC
  Administered 2023-08-04: 80 mg via EPIDURAL

## 2024-06-21 ENCOUNTER — Ambulatory Visit: Admitting: Family Medicine

## 2024-06-21 ENCOUNTER — Encounter: Payer: Self-pay | Admitting: Family Medicine

## 2024-06-21 ENCOUNTER — Ambulatory Visit (INDEPENDENT_AMBULATORY_CARE_PROVIDER_SITE_OTHER)

## 2024-06-21 VITALS — BP 124/82 | HR 81 | Ht 77.0 in | Wt 278.0 lb

## 2024-06-21 DIAGNOSIS — M25552 Pain in left hip: Secondary | ICD-10-CM | POA: Diagnosis not present

## 2024-06-21 DIAGNOSIS — M1612 Unilateral primary osteoarthritis, left hip: Secondary | ICD-10-CM | POA: Diagnosis not present

## 2024-06-21 DIAGNOSIS — G8929 Other chronic pain: Secondary | ICD-10-CM | POA: Diagnosis not present

## 2024-06-21 DIAGNOSIS — M545 Low back pain, unspecified: Secondary | ICD-10-CM | POA: Diagnosis not present

## 2024-06-21 MED ORDER — TIZANIDINE HCL 4 MG PO TABS: 4.0000 mg | ORAL_TABLET | Freq: Four times a day (QID) | ORAL | 1 refills | Status: AC | PRN

## 2024-06-21 NOTE — Patient Instructions (Addendum)
 Thank you for coming in today.   Please get an Xray today before you leave   A referral for physical therapy has been submitted. A representative from the physical therapy office will contact you to coordinate scheduling after confirming your benefits with your insurance provider. If you do not hear from the physical therapy office within the next 1-2 weeks, please let us  know.    See you back as needed.

## 2024-06-21 NOTE — Progress Notes (Signed)
 "        I, Leotis Batter, CMA acting as a scribe for [name], MD.  Roy Bennett is a 59 y.o. male who presents to [facility] today for back pain. Pt was last seen by Dr. Lloyd on 06/29/23 for LBP.  Today, pt c/o back pain after suffering a fall while skiing a week ago. Pt locates pain to lower back / SI joint region, left side. Sx worse with sitting. Short-term relief with Motrin  / Aleve.   Radiating pain: B LE LE numbness/tingling: denies LE weakness: B LE Aggravates: sitting Treatments tried: Motrin , Aleve  Pertinent review of systems: No fevers or chills  Relevant historical information: Left hip arthritis   Exam:  BP 124/82   Pulse 81   Ht 6' 5 (1.956 m)   Wt 278 lb (126.1 kg)   SpO2 96%   BMI 32.97 kg/m  General: Well Developed, well nourished, and in no acute distress.   MSK: L-spine normal-appearing Nontender palpation spinal midline.  Tender palpation left lumbar paraspinal musculature. Lower extremity strength is intact.  Left hip decreased range of motion.    Lab and Radiology Results  X-ray images lumbar spine and left hip obtained today personally and independently interpreted.  Lumbar spine: Degenerative changes worse at L5-S1 no acute fractures are present.  Left hip: Severe left hip DJD.  No acute fractures are visible.  Await formal radiology review     Assessment and Plan: 59 y.o. male with left low back pain and left hip pain after a fall while skiing for 5 days ago.  Fortunately he does not have worsening lumbar radicular symptoms.  Pain due to muscle dysfunction.  Additionally exacerbation of underlying hip arthritis is a factor as well.  Plan for physical therapy and tizanidine.  Recommend Tylenol  and ibuprofen  as well.  We talked about stronger pain medications and he would like to avoid this for now.  We also talked about his hip.  He has had longstanding left hip arthritis and is thinking about proceeding  with a hip replacement.  Will go ahead and refer to orthopedic surgery now to start talking about hip replacement options in the future.   PDMP not reviewed this encounter. Orders Placed This Encounter  Procedures   DG Lumbar Spine 2-3 Views    Standing Status:   Future    Number of Occurrences:   1    Expiration Date:   07/22/2024    Reason for Exam (SYMPTOM  OR DIAGNOSIS REQUIRED):   low back pain    Preferred imaging location?:   [place] [facility]   DG HIP UNILAT W OR W/O PELVIS 2-3 VIEWS LEFT    Standing Status:   Future    Number of Occurrences:   1    Expiration Date:   07/22/2024    Reason for Exam (SYMPTOM  OR DIAGNOSIS REQUIRED):   low back / left hip pain    Preferred imaging location?:    [facility]   Ambulatory referral to Physical Therapy    Referral Priority:   Routine    Referral Type:   Physical Medicine    Referral Reason:   Specialty Services Required    Requested Specialty:   Physical Therapy    Number of Visits Requested:   1   Ambulatory referral to Orthopedic Surgery    Referral Priority:   Routine    Referral Type:   Surgical    Referral Reason:  Specialty Services Required    Requested Specialty:   Orthopedic Surgery    Number of Visits Requested:   1   Meds ordered this encounter  Medications   tiZANidine (ZANAFLEX) 4 MG tablet    Sig: Take 1 tablet (4 mg total) by mouth every 6 (six) hours as needed for muscle spasms.    Dispense:  30 tablet    Refill:  1     Discussed warning signs or symptoms. Please see discharge instructions. Patient expresses understanding.   The above documentation has been reviewed and is accurate and complete [name], M.D.   "

## 2024-06-26 ENCOUNTER — Ambulatory Visit: Admitting: Family Medicine

## 2024-06-27 ENCOUNTER — Ambulatory Visit (INDEPENDENT_AMBULATORY_CARE_PROVIDER_SITE_OTHER): Admitting: Emergency Medicine

## 2024-06-27 ENCOUNTER — Encounter: Payer: Self-pay | Admitting: Emergency Medicine

## 2024-06-27 VITALS — BP 126/78 | HR 75 | Ht 77.0 in | Wt 280.4 lb

## 2024-06-27 DIAGNOSIS — R918 Other nonspecific abnormal finding of lung field: Secondary | ICD-10-CM

## 2024-06-27 NOTE — Patient Instructions (Signed)
 We reviewed your CT scans of the chest from 2024 and going back to 2022.  Also reviewed your PET scan from 2022 and a bronchoscopy results.  The workup is reassuring.  Your pulmonary nodules can be deemed benign.  This is good news.  They likely relate to your time living in the [place]  [facility]. You do not need any repeat scans to follow these nodules. Please follow Dr. Shelah if you develop any concerning respiratory symptoms.

## 2024-06-27 NOTE — Progress Notes (Signed)
" ° °  Subjective:    Patient ID: Roy Bennett, male    DOB: 04-28-1966, 59 y.o.   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    ROV 06/27/2024 --follow-up visit for Marcey.  He is a never smoker with the history of OSA, hyperlipidemia, heterozygosity for hemochromatosis.  He had pulmonary nodular disease and mediastinal adenopathy noted on a cardiac calcium  scoring CT scan of the chest and then confirmed on standard CT back in 2022.  His workup included a negative PET scan/13/22 and a bronchoscopy 10/12/2020 that was negative for malignancy or granulomas.  His cultures were negative as well.  He has had imaging since I have seen him including a CT-PA done 7//24 (when admitted for A Fib) and another cardiac scoring CT done 05/29/2023.  His 11 mm and 5 mm right lower lobe nodules were stable on those scans. Has lived in [address], has lived in [place].  He is active, plays tennis. No respiratory symptoms.   CT-PA 7//24 reviewed by me, shows no evidence of PE, no evidence of aneurysmal dilatation.  The mediastinum was normal.  He had stable nodular changes in the right lower lobe compared with 2022.  Cardiac calcium  scoring CT chest 05/29/2023 reviewed by me again showed stable pulmonary nodules including the 11 mm right lower lobe nodule, 5 mm right lower lobe nodule, all unchanged   Review of Systems As per HPI      Objective:   Physical Exam Vitals:   06/27/24 0926  BP: 126/78  Pulse: 75  SpO2: 98%  Weight: 280 lb 6.4 oz (127.2 kg)  Height: 6' 5 (1.956 m)    Gen: Pleasant, well-nourished, in no distress,  normal affect  ENT: No lesions,  mouth clear,  oropharynx clear, no postnasal drip  Neck: No JVD, no stridor  Lungs: No use of accessory muscles, no crackles or wheezing on normal respiration, no wheeze on forced  expiration  Cardiovascular: RRR, heart sounds normal, no murmur or gallops, no peripheral edema  Musculoskeletal: No deformities, no cyanosis or clubbing  Neuro: alert, awake, non focal  Skin: Warm, no lesions or rash      Assessment & Plan:  Pulmonary nodules/lesions, multiple We reviewed your CT scans of the chest from 2024 and going back to 2022.  Also reviewed your PET scan from 2022 and a bronchoscopy results.  The workup is reassuring.  Your pulmonary nodules can be deemed benign.  This is good news.  They likely relate to your time living in the [place]  [place]. You do not need any repeat scans to follow these nodules. Please follow Dr. Shelah if you develop any concerning respiratory symptoms.  I personally spent a total of 27 minutes in the care of the patient today including preparing to see the patient, getting/reviewing separately obtained history, performing a medically appropriate exam/evaluation, counseling and educating, documenting clinical information in the EHR, independently interpreting results, and communicating results.   Lamar Shelah, MD, PhD 06/27/2024, 9:47 AM [facility] [phone] or if no answer before 7:00PM call [phone] For any issues after 7:00PM please call eLink [phone]  "

## 2024-06-27 NOTE — Assessment & Plan Note (Signed)
 We reviewed your CT scans of the chest from 2024 and going back to 2022.  Also reviewed your PET scan from 2022 and a bronchoscopy results.  The workup is reassuring.  Your pulmonary nodules can be deemed benign.  This is good news.  They likely relate to your time living in the [place]  [facility]. You do not need any repeat scans to follow these nodules. Please follow Dr. Shelah if you develop any concerning respiratory symptoms.

## 2024-07-01 ENCOUNTER — Ambulatory Visit: Payer: Self-pay | Admitting: Family Medicine

## 2024-07-01 NOTE — Progress Notes (Signed)
 Lumbar spine x-ray does show some arthritis.  No broken bones.

## 2024-07-01 NOTE — Progress Notes (Signed)
Left hip x-ray shows medium arthritis

## 2024-07-03 ENCOUNTER — Encounter: Payer: Self-pay | Admitting: Orthopaedic Surgery

## 2024-07-03 ENCOUNTER — Ambulatory Visit: Admitting: Orthopaedic Surgery

## 2024-07-03 DIAGNOSIS — M1612 Unilateral primary osteoarthritis, left hip: Secondary | ICD-10-CM

## 2024-07-03 NOTE — Progress Notes (Signed)
 The patient is a very pleasant and active 59 year old sent to me from Dr. [name] to evaluate and treat known end-stage arthritis of his left hip.  He has been hurting for several years now but got significantly worse.  Back in 2016 he says he probably injured that hip skiing when his hip really jammed in.  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  At this point though his hip pain is daily and it is detrimentally affecting his mobility, his quality of life and his activities of daily living.  He is here to discuss hip replacement surgery.  He is not a diabetic.  He is not morbidly obese.  He is 6 feet 5 inches tall.  He has a harder time getting his shoes and socks on now and he walks with a limp and he tries to compensate for his gait.  He has had massage therapy and therapy overall with hip strengthening exercises.  He has had intra-articular steroid injections in the left hip joint as well to help temporize his pain.  I did review his past medical history and medications within epic.  His wife is with him today as well.  He does walk with an Trendelenburg gait and he is very stiff when he first stands up.  His left hip rotation is very limited and severely stiff and painful with the arc of motion of his hip.  His right hip has pain with external rotation and some stiffness but much less compared to the right.  An AP pelvis and lateral of his hips were reviewed with him.  His hips both show evidence of femoral acetabular pain joint but his left hip is now complete bone-on-bone wear.  There is flattening of the femoral head as well as particular osteophytes.  We had a long and thorough discussion about hip replacement surgery.  I showed him a hip replacement model and gave him a handout about hip replacement surgery.  We discussed the risks and benefits of surgery and what to expect from an intraoperative and postoperative standpoint.   All questions and concerns were addressed and answered.  Will work on getting him scheduled for a left hip replacement in the near future.

## 2024-07-09 ENCOUNTER — Encounter: Payer: Self-pay | Admitting: Orthopaedic Surgery

## 2024-07-10 NOTE — Telephone Encounter (Signed)
 I called patient to discuss scheduling surgery.  He will speak with wife about dates and call me back.

## 2024-07-17 NOTE — Telephone Encounter (Signed)
 Patient left message when we were closed due to weather wanting to set surgery date.  I called patient back and left voice mail for return call.

## 2024-07-19 ENCOUNTER — Telehealth: Payer: Self-pay

## 2024-07-19 NOTE — Telephone Encounter (Signed)
 Received voice mail from patient to discuss scheduling surgery.  I called patient and left voice mail for return call.

## 2024-07-30 ENCOUNTER — Ambulatory Visit (HOSPITAL_BASED_OUTPATIENT_CLINIC_OR_DEPARTMENT_OTHER): Admitting: Physical Therapy

## 2024-08-06 ENCOUNTER — Encounter (HOSPITAL_BASED_OUTPATIENT_CLINIC_OR_DEPARTMENT_OTHER): Admitting: Physical Therapy

## 2024-08-13 ENCOUNTER — Encounter (HOSPITAL_BASED_OUTPATIENT_CLINIC_OR_DEPARTMENT_OTHER): Admitting: Physical Therapy

## 2024-08-20 ENCOUNTER — Encounter (HOSPITAL_BASED_OUTPATIENT_CLINIC_OR_DEPARTMENT_OTHER): Admitting: Physical Therapy

## 2024-08-29 ENCOUNTER — Encounter: Admitting: Orthopaedic Surgery
